# Patient Record
Sex: Male | Born: 1950 | Race: White | Hispanic: No | Marital: Married | Smoking: Never smoker
Health system: Southern US, Community
[De-identification: ages and names within clinical notes are randomized; demographics above are authoritative.]

## PROBLEM LIST (undated history)

## (undated) DIAGNOSIS — C801 Malignant (primary) neoplasm, unspecified: Secondary | ICD-10-CM

## (undated) DIAGNOSIS — L570 Actinic keratosis: Secondary | ICD-10-CM

## (undated) DIAGNOSIS — I10 Essential (primary) hypertension: Secondary | ICD-10-CM

## (undated) DIAGNOSIS — E785 Hyperlipidemia, unspecified: Secondary | ICD-10-CM

## (undated) HISTORY — PX: COLONOSCOPY: SHX174

## (undated) HISTORY — DX: Actinic keratosis: L57.0

## (undated) HISTORY — PX: COLON SURGERY: SHX602

---

## 2008-02-19 ENCOUNTER — Inpatient Hospital Stay: Payer: Self-pay | Admitting: General Surgery

## 2008-02-19 ENCOUNTER — Other Ambulatory Visit: Payer: Self-pay

## 2008-03-18 ENCOUNTER — Ambulatory Visit: Payer: Self-pay | Admitting: General Surgery

## 2008-06-24 ENCOUNTER — Ambulatory Visit: Payer: Self-pay | Admitting: General Surgery

## 2008-11-08 ENCOUNTER — Ambulatory Visit: Payer: Self-pay | Admitting: Unknown Physician Specialty

## 2009-10-31 IMAGING — US ABDOMEN ULTRASOUND
1 series · 17 of 25 positions shown · non-contrast
Comparison: none

REASON FOR EXAM: Hypodense renal and liver lesions identified on CT,
assess for cystic disease
COMMENTS:

[Series 1: abdomen ultrasound · 17 of 103 slices shown]
[im 1/103]
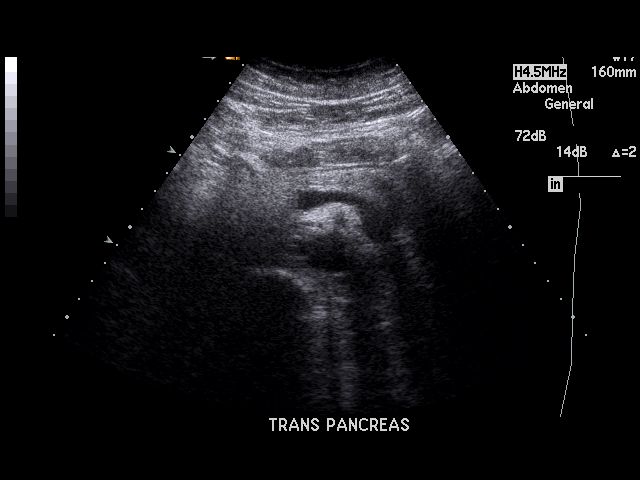
[im 9/103]
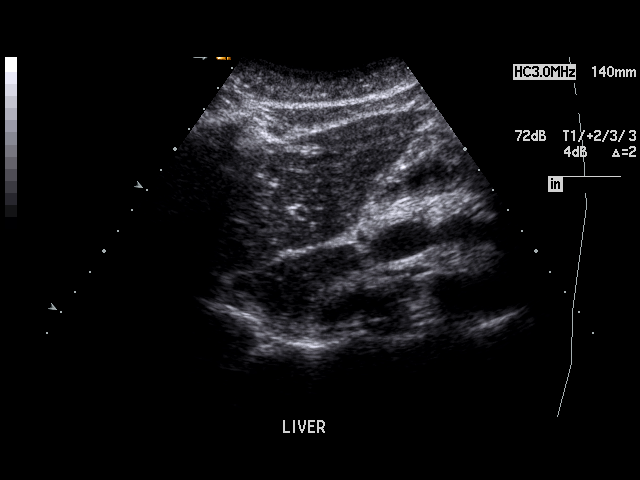
[im 13/103]
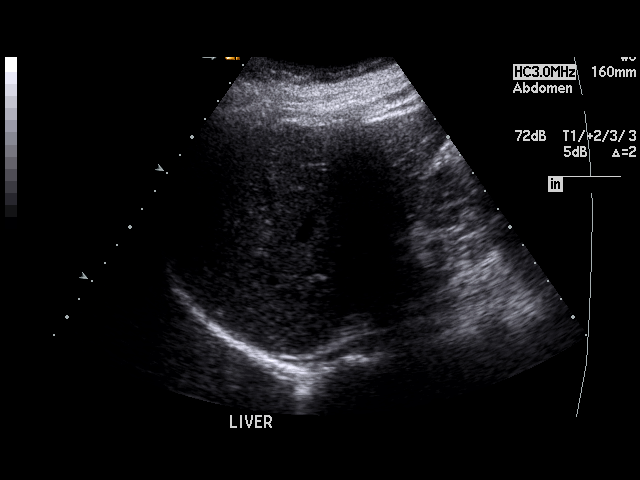
[im 22/103]
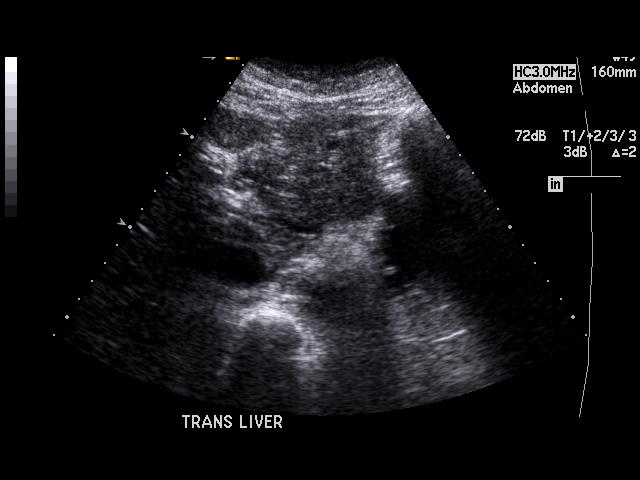
[im 26/103]
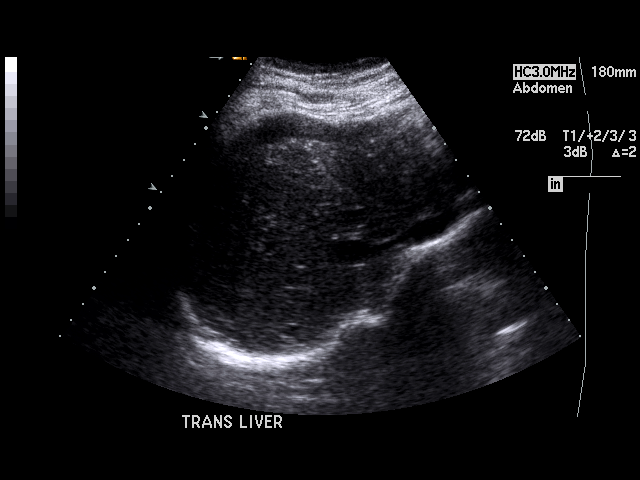
[im 35/103]
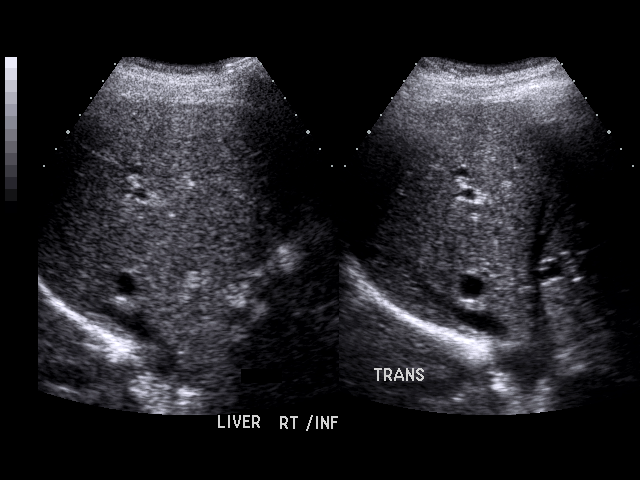
[im 39/103]
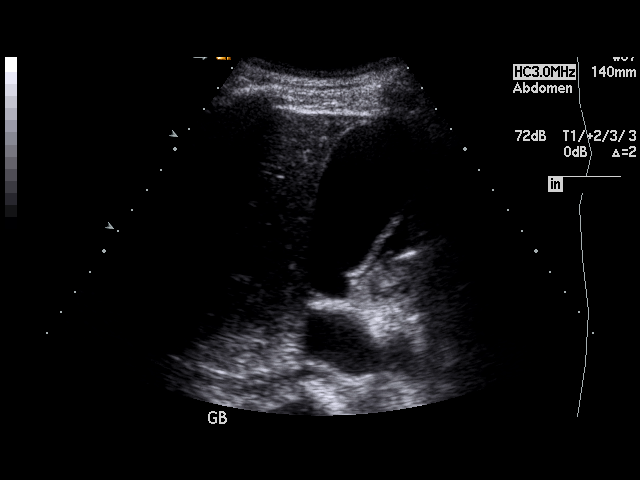
[im 47/103]
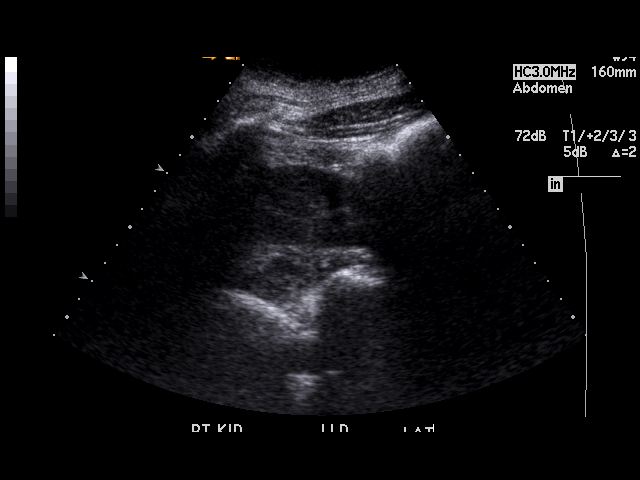
[im 52/103]
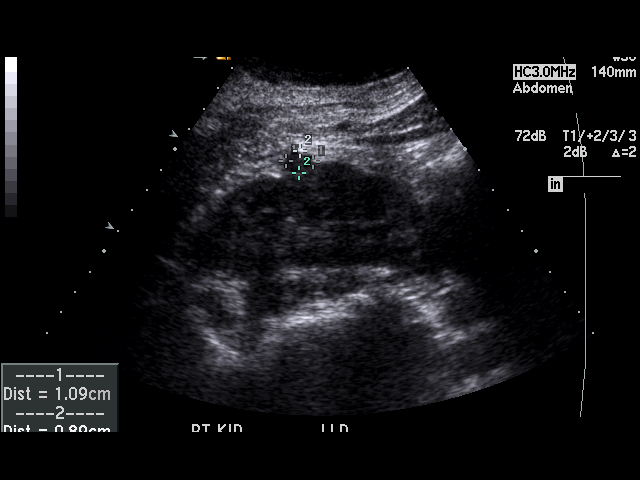
[im 56/103]
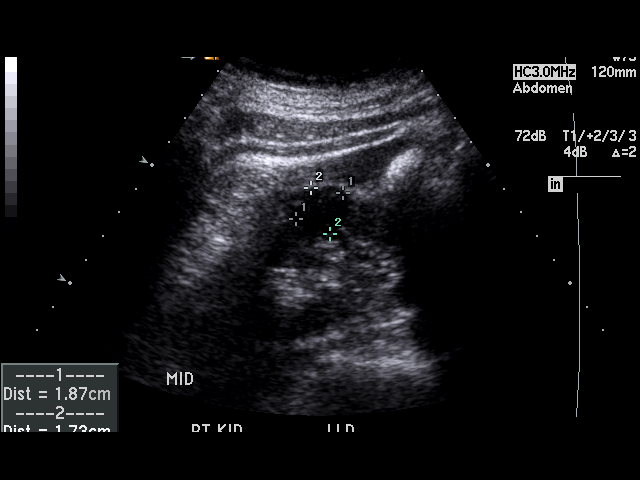
[im 64/103]
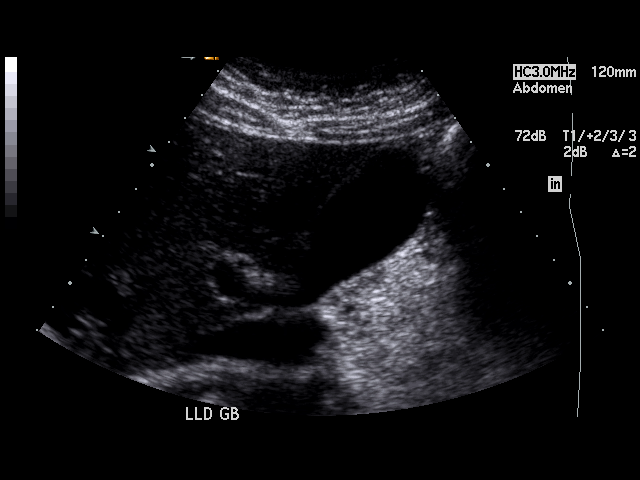
[im 69/103]
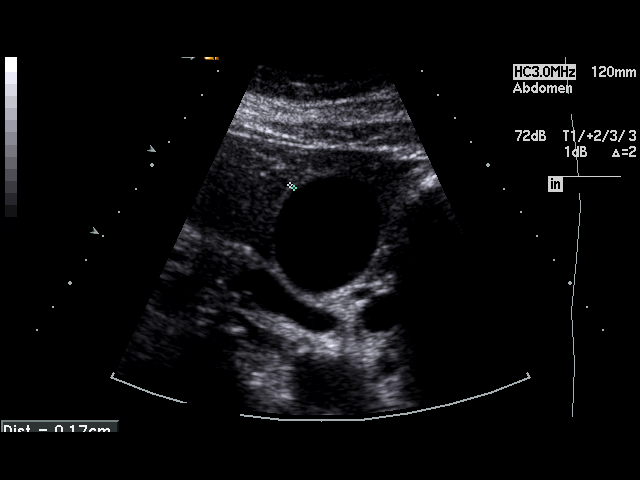
[im 77/103]
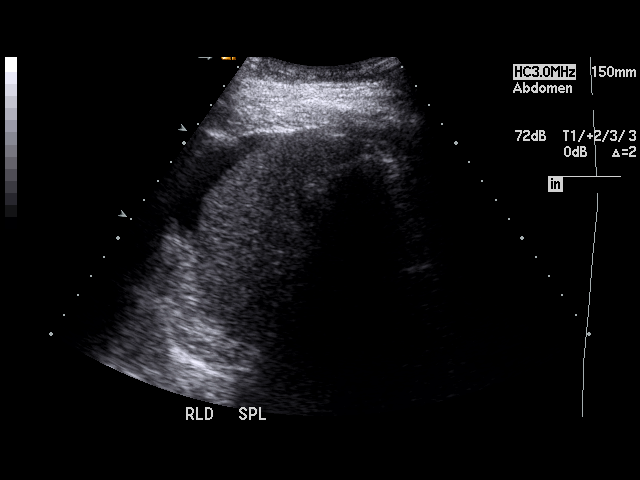
[im 81/103]
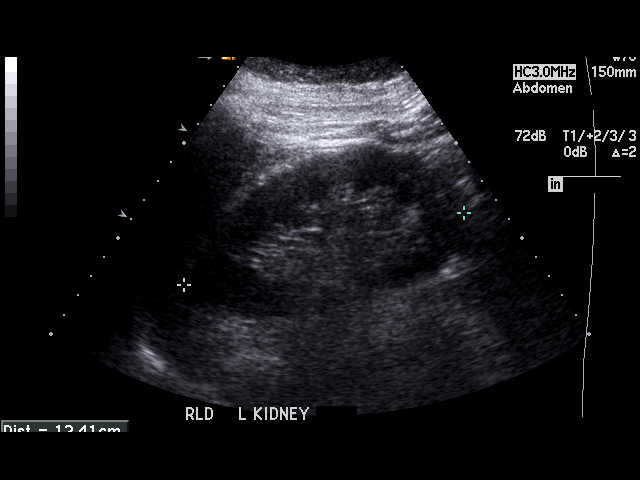
[im 90/103]
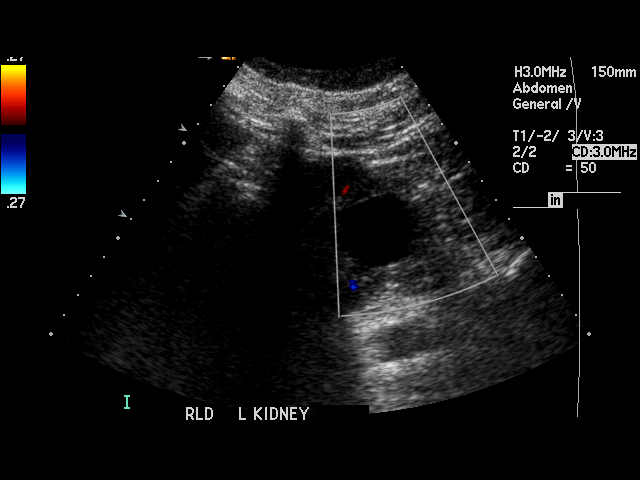
[im 94/103]
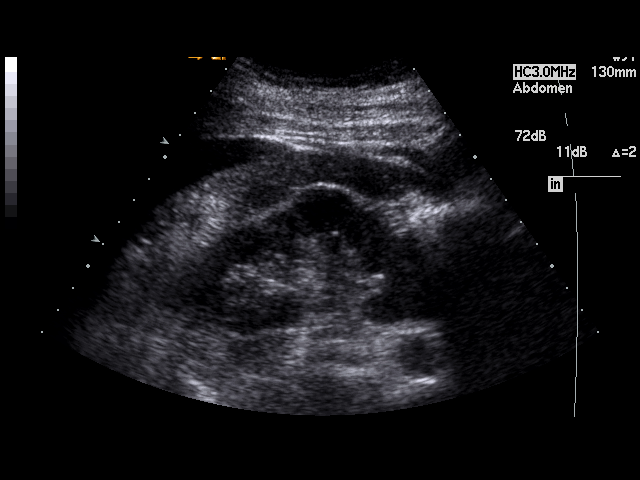
[im 103/103]
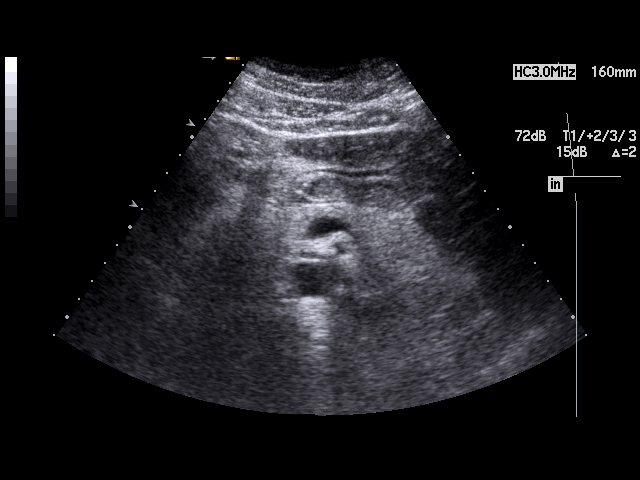

[17 of 25 positions shown; findings below may reference images not displayed]

PROCEDURE:     US  - US ABDOMEN GENERAL SURVEY  - February 20, 2008  [DATE]

RESULT:     The hepatic echo pattern is homogeneous except for multiple,
small hypoechoic areas compatible with small hepatic cysts. The spleen size
is normal. The pancreas is in great part obscured by bowel gas. The inferior
vena cava and abdominal aorta show no significant abnormalities. No
gallstones are seen. There is no thickening of the gallbladder wall. The
common bile duct measures 3.2 mm in diameter which is within normal limits.
The kidneys show no hydronephrosis. There is a complex, 2.25 cm mass of the
RIGHT kidney which has been previously noted at CT and which could be
further evaluated with dedicated renal CT, if clinically indicated.
Additionally, there is noted a 3.26 cm, simple cyst of the LEFT kidney. No
hydronephrosis is seen. Trace ascites is observed.
IMPRESSION: 1.  There is a nonspecific, complex mass of the RIGHT kidney, previously
noted at CT and which could be further evaluated by dedicated renal CT, if
clinically indicated.
2.  Multiple hepatic cysts are also observed.
3.  No gallstones are noted.
4.  Trace ascites.

## 2012-03-03 ENCOUNTER — Ambulatory Visit: Payer: Self-pay | Admitting: Unknown Physician Specialty

## 2012-03-06 LAB — PATHOLOGY REPORT

## 2016-03-30 DIAGNOSIS — D519 Vitamin B12 deficiency anemia, unspecified: Secondary | ICD-10-CM | POA: Insufficient documentation

## 2017-07-19 DIAGNOSIS — S92902A Unspecified fracture of left foot, initial encounter for closed fracture: Secondary | ICD-10-CM

## 2017-07-19 HISTORY — DX: Unspecified fracture of left foot, initial encounter for closed fracture: S92.902A

## 2019-08-07 ENCOUNTER — Ambulatory Visit: Payer: Medicare Other | Attending: Internal Medicine

## 2019-08-07 DIAGNOSIS — Z23 Encounter for immunization: Secondary | ICD-10-CM

## 2019-08-07 NOTE — Progress Notes (Signed)
   Covid-19 Vaccination Clinic  Name:  Billy Wells    MRN: [id number] DOB: [birth year]  08/07/2019  Billy Wells was observed post Covid-19 immunization for 15 minutes without incidence. He was provided with Vaccine Information Sheet and instruction to access the V-Safe system.   Billy Wells was instructed to call 911 with any severe reactions post vaccine: Marland Kitchen Difficulty breathing  . Swelling of your face and throat  . A fast heartbeat  . A bad rash all over your body  . Dizziness and weakness    Immunizations Administered    Name Date Dose VIS Date Route   Pfizer COVID-19 Vaccine 08/07/2019  6:09 PM 0.3 mL 06/29/2019 Intramuscular   Manufacturer: [place]   Lot: [id number]   [place]: [id number]

## 2019-08-25 ENCOUNTER — Ambulatory Visit: Payer: Medicare Other | Attending: Internal Medicine

## 2019-08-25 DIAGNOSIS — Z23 Encounter for immunization: Secondary | ICD-10-CM | POA: Insufficient documentation

## 2019-08-25 NOTE — Progress Notes (Signed)
   Covid-19 Vaccination Clinic  Name:  Billy Wells    MRN: [id number] DOB: 24-Sep-1950  08/25/2019  Mr. Wells was observed post Covid-19 immunization for 15 minutes without incidence. He was provided with Vaccine Information Sheet and instruction to access the V-Safe system.   Mr. Wells was instructed to call 911 with any severe reactions post vaccine: Marland Kitchen Difficulty breathing  . Swelling of your face and throat  . A fast heartbeat  . A bad rash all over your body  . Dizziness and weakness    Immunizations Administered    Name Date Dose VIS Date Route   Pfizer COVID-19 Vaccine 08/25/2019  1:25 PM 0.3 mL 06/29/2019 Intramuscular   Manufacturer: [place]   Lot: [id number]   [place]: [id number]

## 2020-05-26 ENCOUNTER — Ambulatory Visit: Payer: Self-pay | Admitting: Urology

## 2020-05-29 ENCOUNTER — Other Ambulatory Visit: Payer: Self-pay

## 2020-05-29 ENCOUNTER — Encounter: Payer: Self-pay | Admitting: Urology

## 2020-05-29 ENCOUNTER — Ambulatory Visit (INDEPENDENT_AMBULATORY_CARE_PROVIDER_SITE_OTHER): Payer: Medicare Other | Admitting: Urology

## 2020-05-29 VITALS — BP 153/90 | HR 69 | Ht 73.0 in | Wt 175.0 lb

## 2020-05-29 DIAGNOSIS — R972 Elevated prostate specific antigen [PSA]: Secondary | ICD-10-CM | POA: Diagnosis not present

## 2020-05-29 DIAGNOSIS — N400 Enlarged prostate without lower urinary tract symptoms: Secondary | ICD-10-CM | POA: Diagnosis not present

## 2020-05-29 NOTE — Progress Notes (Signed)
   05/29/2020 10:31 AM   Billy Wells [birth year] [id number]  Referring provider: [name], MD [place],  [place] [address]  Chief Complaint  Patient presents with  . Elevated PSA    HPI: Billy Wells is a 69 y.o. male seen at the request of Dr. Ola Spurr for evaluation of an elevated PSA.   PSA 05/09/2020 10.9  Slowly rising PSA since 2019 at 4.13-10.9  Denies family history prostate cancer  No bothersome LUTS  Denies dysuria, gross hematuria, recurrent UTI  No flank, abdominal or pelvic pain  Denies prior urologic evaluation       PMH: . Hyperlipidemia  . Hypertension   Surgical History:  None  Home Medications:  Allergies as of 05/29/2020   No Known Allergies     Medication List       Accurate as of May 29, 2020 10:31 AM. If you have any questions, ask your nurse or doctor.        amLODipine 10 MG tablet Commonly known as: NORVASC Take 1 tablet by mouth daily.   Saw Palmetto 450 MG Caps Take by mouth.   tamsulosin 0.4 MG Caps capsule Commonly known as: FLOMAX Take by mouth.       Allergies: No Known Allergies  Family History: History reviewed. No pertinent family history.  Social History:  reports that he has never smoked. He has never used smokeless tobacco. He reports current alcohol use. No history on file for drug use.   Physical Exam: BP (!) 153/90   Pulse 69   Ht 6\' 1"  (1.854 m)   Wt 175 lb (79.4 kg)   BMI 23.09 kg/m   Constitutional:  Alert and oriented, No acute distress. HEENT: [place] AT, moist mucus membranes.  Trachea midline, no masses. Cardiovascular: No clubbing, cyanosis, or edema. Respiratory: Normal respiratory effort, no increased work of breathing. GU: Prostate 60+ cc, L >R without induration or nodularity Lymph: No cervical or inguinal lymphadenopathy. Skin: No rashes, bruises or suspicious lesions. Neurologic: Grossly intact, no focal deficits, moving all 4  extremities. Psychiatric: Normal mood and affect.   Assessment & Plan:    1.  Elevated PSA  Although PSA is a prostate cancer screening test he was informed that cancer is not the most common cause of an elevated PSA. Other potential causes including BPH and inflammation were discussed. He was informed that the only way to adequately diagnose prostate cancer would be a transrectal ultrasound and biopsy of the prostate. The procedure was discussed including potential risks of bleeding and infection/sepsis. He was also informed that a negative biopsy does not conclusively rule out the possibility that prostate cancer may be present and that continued monitoring is required. The use of newer adjunctive blood tests including PHI and 4kScore were discussed. The use of multiparametric prostate MRI was also discussed however is not typically used for initial evaluation of an elevated PSA. Continued periodic surveillance was also discussed.  Have initially recommended a prostate MRI for further evaluation would not recommend continued surveillance.  He was in agreement with this plan    Abbie Sons, MD  [facility] [address], [place], [place] [address] [phone]

## 2020-06-01 ENCOUNTER — Encounter: Payer: Self-pay | Admitting: Urology

## 2020-06-19 ENCOUNTER — Other Ambulatory Visit: Payer: Self-pay

## 2020-06-19 ENCOUNTER — Ambulatory Visit
Admission: RE | Admit: 2020-06-19 | Discharge: 2020-06-19 | Disposition: A | Discharge status: Home / Self Care | Payer: Medicare Other | Source: Ambulatory Visit | Attending: Urology | Admitting: Urology

## 2020-06-19 DIAGNOSIS — R972 Elevated prostate specific antigen [PSA]: Secondary | ICD-10-CM | POA: Diagnosis present

## 2020-06-19 MED ORDER — GADOBUTROL 1 MMOL/ML IV SOLN
8.0000 mL | Freq: Once | INTRAVENOUS | Status: AC | PRN
  Administered 2020-06-19: 8 mL via INTRAVENOUS

## 2020-06-20 ENCOUNTER — Encounter: Payer: Self-pay | Admitting: Urology

## 2020-06-22 ENCOUNTER — Other Ambulatory Visit: Payer: Self-pay | Admitting: Urology

## 2020-06-22 DIAGNOSIS — R972 Elevated prostate specific antigen [PSA]: Secondary | ICD-10-CM

## 2020-07-31 ENCOUNTER — Other Ambulatory Visit: Payer: Self-pay | Admitting: Urology

## 2020-08-06 ENCOUNTER — Encounter: Payer: Self-pay | Admitting: Urology

## 2020-08-06 ENCOUNTER — Ambulatory Visit (INDEPENDENT_AMBULATORY_CARE_PROVIDER_SITE_OTHER): Payer: Medicare Other | Admitting: Urology

## 2020-08-06 ENCOUNTER — Other Ambulatory Visit: Payer: Self-pay

## 2020-08-06 VITALS — BP 155/85 | HR 71 | Ht 73.0 in | Wt 178.0 lb

## 2020-08-06 DIAGNOSIS — C61 Malignant neoplasm of prostate: Secondary | ICD-10-CM | POA: Diagnosis not present

## 2020-08-06 NOTE — Progress Notes (Signed)
   08/06/2020 10:30 AM   Billy Wells 05-Jul-1951 [id number]  Referring provider: [name], MD [place],  [place] [address]  Chief Complaint  Patient presents with  . Elevated PSA    HPI: 70 y.o. male presents for prostate biopsy results.   MR fusion biopsy performed in [facility] 07/28/2020 for PSA of 10.1 and PI-RADS 4/3 lesions on MRI  No postbiopsy complaints; prostate volume 45.1 g  Standard 12 core biopsy performed in addition to 3 ROI cores PI-RADS 4 lesion right and 3 ROI cores PI-RADS 3 lesion left  Pathology: The PI-RADS 4 lesion with 2/3 cores positive Gleason 4+3 adenocarcinoma involving 70% of each core   Right mid template core with Gleason 3+3 adenocarcinoma 5%   PMH: No past medical history on file.  Surgical History: No past surgical history on file.  Home Medications:  Allergies as of 08/06/2020   No Known Allergies     Medication List       Accurate as of August 06, 2020 10:30 AM. If you have any questions, ask your nurse or doctor.        amLODipine 10 MG tablet Commonly known as: NORVASC Take 1 tablet by mouth daily.   levofloxacin 750 MG tablet Commonly known as: LEVAQUIN Take 750 mg by mouth daily.   Saw Palmetto 450 MG Caps Take by mouth.   tamsulosin 0.4 MG Caps capsule Commonly known as: FLOMAX Take by mouth.       Allergies: No Known Allergies  Family History: No family history on file.  Social History:  reports that he has never smoked. He has never used smokeless tobacco. He reports current alcohol use. No history on file for drug use.   Physical Exam: BP (!) 155/85   Pulse 71   Ht 6\' 1"  (1.854 m)   Wt 178 lb (80.7 kg)   BMI 23.48 kg/m   Constitutional:  Alert, No acute distress.   Assessment & Plan:    1.  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  Based on age and pathology surveillance not  recommended  Curative treatment options were discussed including radical prostatectomy and radiation modalities  Potential side effects radical prostatectomy discussed including erectile dysfunction and urinary incontinence  Side effects of radiation were reviewed including GI and bladder irritation/bleeding  It was discussed that there is not considered a "best" treatment for prostate cancer and that radiation and surgery are generally considered equivalent  He was provided literature on prostate cancer treatment  I did recommend a radiation oncology appointment to discuss radiation further and offered him appointment with 1 of my partners if he desires more information on  I spent 30 total minutes on the day of the encounter including pre-visit review of the medical record, face-to-face time with the patient, and post visit ordering of labs/imaging/tests.   Abbie Sons, [facility] [address] [place], [place] [address] [phone]

## 2020-08-11 ENCOUNTER — Ambulatory Visit (INDEPENDENT_AMBULATORY_CARE_PROVIDER_SITE_OTHER): Payer: Medicare Other | Admitting: Dermatology

## 2020-08-11 ENCOUNTER — Other Ambulatory Visit: Payer: Self-pay

## 2020-08-11 DIAGNOSIS — D1800 Hemangioma unspecified site: Secondary | ICD-10-CM

## 2020-08-11 DIAGNOSIS — L814 Other melanin hyperpigmentation: Secondary | ICD-10-CM

## 2020-08-11 DIAGNOSIS — L821 Other seborrheic keratosis: Secondary | ICD-10-CM

## 2020-08-11 DIAGNOSIS — L578 Other skin changes due to chronic exposure to nonionizing radiation: Secondary | ICD-10-CM | POA: Diagnosis not present

## 2020-08-11 DIAGNOSIS — D692 Other nonthrombocytopenic purpura: Secondary | ICD-10-CM

## 2020-08-11 DIAGNOSIS — Z1283 Encounter for screening for malignant neoplasm of skin: Secondary | ICD-10-CM | POA: Diagnosis not present

## 2020-08-11 DIAGNOSIS — L720 Epidermal cyst: Secondary | ICD-10-CM

## 2020-08-11 DIAGNOSIS — D229 Melanocytic nevi, unspecified: Secondary | ICD-10-CM

## 2020-08-11 NOTE — Progress Notes (Signed)
   Follow-Up Visit   Subjective  Billy Wells is a 70 y.o. male who presents for the following: Annual Exam (UBSE. Hx Aks. No hx of skin cancer. No areas of concern today.).   The following portions of the chart were reviewed this encounter and updated as appropriate:       Review of Systems:  No other skin or systemic complaints except as noted in HPI or Assessment and Plan.  Objective  Well appearing patient in no apparent distress; mood and affect are within normal limits.  All skin waist up examined.  Objective  Spinal Mid Back: Large dilated pore c/w punctum   Assessment & Plan   Skin cancer screening performed today.  Actinic Damage - chronic, secondary to cumulative UV radiation exposure/sun exposure over time - diffuse scaly erythematous macules with underlying dyspigmentation - Recommend daily broad spectrum sunscreen SPF 30+ to sun-exposed areas, reapply every 2 hours as needed.  - Call for new or changing lesions.  Seborrheic Keratoses - Stuck-on, waxy, tan-brown macules/papules - Discussed benign etiology and prognosis. - Observe - Call for any changes  Lentigines - Scattered tan macules - Discussed due to sun exposure - Benign, observe - Recommend daily broad spectrum sunscreen SPF 30+ to sun-exposed areas, reapply every 2 hours as needed. - Call for any changes  Purpura - Chronic; persistent and recurrent.  Treatable, but not curable. - Violaceous macules and patches - Benign - Related to trauma, age, sun damage and/or use of blood thinners, chronic use of topical and/or oral steroids - Observe - Can use OTC arnica containing moisturizer such as Dermend Bruise Formula if desired - Call for worsening or other concerns  Hemangiomas - Red papules - Discussed benign nature - Observe - Call for any changes  Melanocytic Nevi - Tan-brown and/or pink-flesh-colored symmetric macules and papules - Benign appearing on exam today - Observation -  Call clinic for new or changing moles - Recommend daily use of broad spectrum spf 30+ sunscreen to sun-exposed areas.   Milia - tiny firm white papules of the right medial canthus - type of cyst - benign - may be extracted if symptomatic - observe   Epidermal inclusion cyst Spinal Mid Back  Benign-appearing. Exam most consistent with an epidermal inclusion cyst. Discussed that a cyst is a benign growth that can grow over time and sometimes get irritated or inflamed. Recommend observation if it is not bothersome. Discussed option of surgical excision to remove it if it is growing, symptomatic, or other changes noted. Please call for new or changing lesions so they can be evaluated.    Return in about 1 year (around 08/11/2021) for UBSE.   IJamesetta Orleans, CMA, am acting as scribe for Brendolyn Patty, MD .  Documentation: I have reviewed the above documentation for accuracy and completeness, and I agree with the above.  Brendolyn Patty MD

## 2020-08-11 NOTE — Patient Instructions (Signed)
Recommend daily broad spectrum sunscreen SPF 30+ to sun-exposed areas, reapply every 2 hours as needed. Call for new or changing lesions.  

## 2020-08-13 ENCOUNTER — Ambulatory Visit
Admission: RE | Admit: 2020-08-13 | Discharge: 2020-08-13 | Disposition: A | Discharge status: Home / Self Care | Payer: Medicare Other | Source: Ambulatory Visit | Attending: Radiation Oncology | Admitting: Radiation Oncology

## 2020-08-13 ENCOUNTER — Encounter: Payer: Self-pay | Admitting: Radiation Oncology

## 2020-08-13 ENCOUNTER — Encounter: Payer: Self-pay | Admitting: Urology

## 2020-08-13 VITALS — BP 145/88 | HR 70 | Temp 97.3°F | Wt 182.5 lb

## 2020-08-13 DIAGNOSIS — Z923 Personal history of irradiation: Secondary | ICD-10-CM | POA: Diagnosis not present

## 2020-08-13 DIAGNOSIS — Z79899 Other long term (current) drug therapy: Secondary | ICD-10-CM | POA: Insufficient documentation

## 2020-08-13 DIAGNOSIS — C61 Malignant neoplasm of prostate: Secondary | ICD-10-CM | POA: Diagnosis not present

## 2020-08-13 NOTE — Consult Note (Signed)
NEW PATIENT EVALUATION  Name: Billy Wells  MRN: [id number]  Date:   08/13/2020     DOB: [birth year]   This 70 y.o. 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  REFERRING PHYSICIAN: [name], MD  CHIEF COMPLAINT:  Chief Complaint  Patient presents with  . Prostate Cancer    DIAGNOSIS: The encounter diagnosis was Prostate cancer ([place]).   PREVIOUS INVESTIGATIONS:  Clinical notes reviewed Prostate MRI reviewed Pathology report reviewed   HPI: Patient is a 70 year old male presented with a rising PSA back over 4 in 2019 most recently was 10.9.  Patient underwent an MRI study showing a PI-RADS category 4 lesion of the right anterior posterior transitional zone and possible right anterior fibromuscular stroma in the mid gland.  Also had a PI-RADS category 3 lesion of the left anterior and posterior transition zone.  He underwent MRI fusion biopsy showing 2 of 3 cores and areas of concern positive for Gleason 7 (4+3).  One of the core was positive for Gleason 6 (3+3).  Patient has very little symptoms.  Specifically denies urgency frequency nocturia.  He has been seen by Dr. Bernardo Heater and discussion regarding robotic prostatectomy was made.  He is now referred to radiation oncology for opinion.  He is having no bone pain.  Patient's prostate volume is closed close to 60 cc making I-125 interstitial implant not recommended again is fairly asymptomatic.  PLANNED TREATMENT REGIMEN: Robotic prostatectomy versus image guided IMRT radiation  PAST MEDICAL HISTORY:  has no past medical history on file.    PAST SURGICAL HISTORY: No past surgical history on file.  FAMILY HISTORY: family history is not on file.  SOCIAL HISTORY:  reports that he has never smoked. He has never used smokeless tobacco. He reports current alcohol use.  ALLERGIES: Patient has no known  allergies.  MEDICATIONS:  Current Outpatient Medications  Medication Sig Dispense Refill  . amLODipine (NORVASC) 10 MG tablet Take 1 tablet by mouth daily.    . Saw Palmetto 450 MG CAPS Take by mouth.    . tamsulosin (FLOMAX) 0.4 MG CAPS capsule Take by mouth.    . levofloxacin (LEVAQUIN) 750 MG tablet Take 750 mg by mouth daily. (Patient not taking: No sig reported)     No current facility-administered medications for this encounter.    ECOG PERFORMANCE STATUS:  0 - Asymptomatic  REVIEW OF SYSTEMS: Patient denies any weight loss, fatigue, weakness, fever, chills or night sweats. Patient denies any loss of vision, blurred vision. Patient denies any ringing  of the ears or hearing loss. No irregular heartbeat. Patient denies heart murmur or history of fainting. Patient denies any chest pain or pain radiating to her upper extremities. Patient denies any shortness of breath, difficulty breathing at night, cough or hemoptysis. Patient denies any swelling in the lower legs. Patient denies any nausea vomiting, vomiting of blood, or coffee ground material in the vomitus. Patient denies any stomach pain. Patient states has had normal bowel movements no significant constipation or diarrhea. Patient denies any dysuria, hematuria or significant nocturia. Patient denies any problems walking, swelling in the joints or loss of balance. Patient denies any skin changes, loss of hair or loss of weight. Patient denies any excessive worrying or anxiety or significant depression. Patient denies any problems with insomnia. Patient denies excessive thirst, polyuria, polydipsia. Patient denies any swollen glands, patient denies easy  bruising or easy bleeding. Patient denies any recent infections, allergies or URI. Patient "s visual fields have not changed significantly in recent time.   PHYSICAL EXAM: BP (!) 145/88   Pulse 70   Temp (!) 97.3 F (36.3 C) (Tympanic)   Wt 182 lb 8 oz (82.8 kg)   BMI 24.08 kg/m   Well-developed well-nourished patient in NAD. HEENT reveals PERLA, EOMI, discs not visualized.  Oral cavity is clear. No oral mucosal lesions are identified. Neck is clear without evidence of cervical or supraclavicular adenopathy. Lungs are clear to A&P. Cardiac examination is essentially unremarkable with regular rate and rhythm without murmur rub or thrill. Abdomen is benign with no organomegaly or masses noted. Motor sensory and DTR levels are equal and symmetric in the upper and lower extremities. Cranial nerves II through XII are grossly intact. Proprioception is intact. No peripheral adenopathy or edema is identified. No motor or sensory levels are noted. Crude visual fields are within normal range.  LABORATORY DATA: Pathology report reviewed    RADIOLOGY RESULTS: MRI scan reviewed compatible with above-stated findings   IMPRESSION: Intermediate stage prostate cancer Gleason 7 (4+3) in patient with PSA of 73 in 70 year old male   PLAN: At this time patient would not be a candidate based on his prostate volume for I-125 interstitial implant I discussed the risk and benefits of image guided IMRT radiation therapy.  Risks include increased lower urinary tract symptoms such as urgency frequency nocturia during treatment possible diarrhea possible fatigue alteration of blood counts.  If he decides on image guided treatment I would asked Dr. Bernardo Heater to place fiducial markers in his prostate.  Also would asked to start 47-month Eligard since studies now show significant improvement in local regional control adding ADT therapy with radiation.  Patient will be deciding between robotic prostatectomy and external beam treatment over the next several next several weeks and will get back to [place] I have definitely discussed continuing observation is not an option.  Patient comprehends my recommendations well.  I would like to take this opportunity to thank you for allowing me to participate in the care of your  patient.Noreene Filbert, MD

## 2020-09-02 ENCOUNTER — Other Ambulatory Visit: Payer: Self-pay | Admitting: Urology

## 2021-03-26 ENCOUNTER — Other Ambulatory Visit: Payer: Self-pay

## 2021-03-26 ENCOUNTER — Ambulatory Visit (INDEPENDENT_AMBULATORY_CARE_PROVIDER_SITE_OTHER): Payer: Medicare Other | Admitting: Dermatology

## 2021-03-26 DIAGNOSIS — L578 Other skin changes due to chronic exposure to nonionizing radiation: Secondary | ICD-10-CM

## 2021-03-26 DIAGNOSIS — L820 Inflamed seborrheic keratosis: Secondary | ICD-10-CM | POA: Diagnosis not present

## 2021-03-26 DIAGNOSIS — L570 Actinic keratosis: Secondary | ICD-10-CM | POA: Diagnosis not present

## 2021-03-26 NOTE — Patient Instructions (Signed)
Actinic keratoses are precancerous spots that appear secondary to cumulative UV radiation exposure/sun exposure over time. They are chronic with expected duration over 1 year. A portion of actinic keratoses will progress to squamous cell carcinoma of the skin. It is not possible to reliably predict which spots will progress to skin cancer and so treatment is recommended to prevent development of skin cancer.  Recommend daily broad spectrum sunscreen SPF 30+ to sun-exposed areas, reapply every 2 hours as needed.  Recommend staying in the shade or wearing long sleeves, sun glasses (UVA+UVB protection) and wide brim hats (4-inch brim around the entire circumference of the hat). Call for new or changing lesions.   Cryotherapy Aftercare  Wash gently with soap and water everyday.   Apply Vaseline and Band-Aid daily until healed.   Seborrheic Keratosis  What causes seborrheic keratoses? Seborrheic keratoses are harmless, common skin growths that first appear during adult life.  As time goes by, more growths appear.  Some people may develop a large number of them.  Seborrheic keratoses appear on both covered and uncovered body parts.  They are not caused by sunlight.  The tendency to develop seborrheic keratoses can be inherited.  They vary in color from skin-colored to gray, brown, or even black.  They can be either smooth or have a rough, warty surface.   Seborrheic keratoses are superficial and look as if they were stuck on the skin.  Under the microscope this type of keratosis looks like layers upon layers of skin.  That is why at times the top layer may seem to fall off, but the rest of the growth remains and re-grows.    Treatment Seborrheic keratoses do not need to be treated, but can easily be removed in the office.  Seborrheic keratoses often cause symptoms when they rub on clothing or jewelry.  Lesions can be in the way of shaving.  If they become inflamed, they can cause itching, soreness, or  burning.  Removal of a seborrheic keratosis can be accomplished by freezing, burning, or surgery. If any spot bleeds, scabs, or grows rapidly, please return to have it checked, as these can be an indication of a skin cancer.   If you have any questions or concerns for your doctor, please call our main line at [phone] and press option 4 to reach your doctor's medical assistant. If no one answers, please leave a voicemail as directed and we will return your call as soon as possible. Messages left after 4 pm will be answered the following business day.   You may also send us a message via MyChart. We typically respond to MyChart messages within 1-2 business days.  For prescription refills, please ask your pharmacy to contact our office. Our fax number is [phone].  If you have an urgent issue when the clinic is closed that cannot wait until the next business day, you can page your doctor at the number below.    Please note that while we do our best to be available for urgent issues outside of office hours, we are not available 24/7.   If you have an urgent issue and are unable to reach us, you may choose to seek medical care at your doctor's office, retail clinic, urgent care center, or emergency room.  If you have a medical emergency, please immediately call 911 or go to the emergency department.  Pager Numbers  - Dr. Kowalski: [phone]  - Dr. Moye: [phone]  - Dr. Stewart: [phone]  In the   event of inclement weather, please call our main line at [phone] for an update on the status of any delays or closures.  Dermatology Medication Tips: Please keep the boxes that topical medications come in in order to help keep track of the instructions about where and how to use these. Pharmacies typically print the medication instructions only on the boxes and not directly on the medication tubes.   If your medication is too expensive, please contact our office at  [phone] option 4 or send us a message through MyChart.   We are unable to tell what your co-pay for medications will be in advance as this is different depending on your insurance coverage. However, we may be able to find a substitute medication at lower cost or fill out paperwork to get insurance to cover a needed medication.   If a prior authorization is required to get your medication covered by your insurance company, please allow us 1-2 business days to complete this process.  Drug prices often vary depending on where the prescription is filled and some pharmacies may offer cheaper prices.  The website www.goodrx.com contains coupons for medications through different pharmacies. The prices here do not account for what the cost may be with help from insurance (it may be cheaper with your insurance), but the website can give you the price if you did not use any insurance.  - You can print the associated coupon and take it with your prescription to the pharmacy.  - You may also stop by our office during regular business hours and pick up a GoodRx coupon card.  - If you need your prescription sent electronically to a different pharmacy, notify our office through [facility] MyChart or by phone at [phone] option 4.  

## 2021-03-26 NOTE — Progress Notes (Signed)
Follow-Up Visit   Subjective  Billy Wells is a 70 y.o. male who presents for the following: Follow-up (Patient here today concerning a spot on his left lower leg. He reports it has been there for a year or more but it will not go away. Patient has a bump at corner of left eye he noticed 3 months ago and is slowly growing. Patient reports that he been undergoing radiation treatment for prostate cancer. ).  The following portions of the chart were reviewed this encounter and updated as appropriate:  Tobacco  Allergies  Meds  Problems  Med Hx  Surg Hx  Fam Hx     Objective  Well appearing patient in no apparent distress; mood and affect are within normal limits.  A focused examination was performed including left distal pretibial and right medial lower eyelid. Relevant physical exam findings are noted in the Assessment and Plan.  right medial lower eyelid x 1 Cutaneous horn   left distal pretibial x 1 Erythematous keratotic or waxy stuck-on papule or plaque.   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 They are chronic with expected duration over 1 year. A portion of actinic keratoses will progress to squamous cell carcinoma of the skin. It is not possible to reliably predict which spots will progress to skin cancer and so treatment is recommended to prevent development of skin cancer.  Recommend daily broad spectrum sunscreen SPF 30+ to sun-exposed areas, reapply every 2 hours as needed.  Recommend staying in the shade or wearing long sleeves, sun glasses (UVA+UVB protection) and wide brim hats (4-inch brim around the entire circumference of the hat). Call for new or changing lesions.  Destruction of lesion - right medial lower eyelid x 1 Complexity: simple   Destruction method:  cryotherapy   Informed consent: discussed and consent obtained   Timeout:  patient name, date of birth, surgical site, and procedure verified Lesion destroyed using liquid nitrogen: Yes   Region frozen until ice ball extended beyond lesion: Yes   Outcome: patient tolerated procedure well with no complications   Post-procedure details: wound care instructions given    Inflamed seborrheic keratosis left distal pretibial x 1  Destruction of lesion - left distal pretibial x 1 Complexity: simple   Destruction method: cryotherapy   Informed consent: discussed and consent obtained   Timeout:  patient name, date of birth, surgical site, and procedure verified Lesion destroyed using liquid nitrogen: Yes   Region frozen until ice ball extended beyond lesion: Yes   Outcome: patient tolerated procedure well with no complications   Post-procedure details: wound care instructions given    Actinic Damage - chronic, secondary to cumulative UV radiation exposure/sun exposure over time - diffuse scaly erythematous macules with underlying dyspigmentation - Recommend daily broad spectrum sunscreen SPF 30+ to sun-exposed areas, reapply every 2 hours as needed.  - Recommend staying in the shade or wearing long sleeves, sun glasses (UVA+UVB protection) and wide brim hats (4-inch brim around the entire circumference of the hat). - Call for new or changing lesions.  Return in about 3 months (around 06/25/2021) for ak followup and isk follow up. IRuthell Rummage, CMA, am acting as scribe for Sarina Ser, MD. Documentation: I have reviewed the above documentation for accuracy and completeness, and I agree with the above.  Sarina Ser, MD

## 2021-03-31 ENCOUNTER — Encounter: Payer: Self-pay | Admitting: Dermatology

## 2021-07-02 ENCOUNTER — Other Ambulatory Visit: Payer: Self-pay

## 2021-07-02 ENCOUNTER — Ambulatory Visit (INDEPENDENT_AMBULATORY_CARE_PROVIDER_SITE_OTHER): Payer: Medicare Other | Admitting: Dermatology

## 2021-07-02 DIAGNOSIS — L814 Other melanin hyperpigmentation: Secondary | ICD-10-CM

## 2021-07-02 DIAGNOSIS — Z872 Personal history of diseases of the skin and subcutaneous tissue: Secondary | ICD-10-CM | POA: Diagnosis not present

## 2021-07-02 DIAGNOSIS — L578 Other skin changes due to chronic exposure to nonionizing radiation: Secondary | ICD-10-CM | POA: Diagnosis not present

## 2021-07-02 DIAGNOSIS — Z1283 Encounter for screening for malignant neoplasm of skin: Secondary | ICD-10-CM

## 2021-07-02 DIAGNOSIS — L821 Other seborrheic keratosis: Secondary | ICD-10-CM

## 2021-07-02 NOTE — Progress Notes (Signed)
° °  Follow-Up Visit   Subjective  Billy Wells is a 70 y.o. male who presents for the following: Follow-up (3 months f/u hx of Aks ). The patient has spots, moles and lesions to be evaluated, some may be new or changing and the patient has concerns that these could be cancer.   The following portions of the chart were reviewed this encounter and updated as appropriate:   Tobacco   Allergies   Meds   Problems   Med Hx   Surg Hx   Fam Hx      Review of Systems:  No other skin or systemic complaints except as noted in HPI or Assessment and Plan.  Objective  Well appearing patient in no apparent distress; mood and affect are within normal limits.  A full examination was performed including scalp, head, eyes, ears, nose, lips, neck, chest, axillae, abdomen, back, buttocks, bilateral upper extremities, bilateral lower extremities, hands, feet, fingers, toes, fingernails, and toenails. All findings within normal limits unless otherwise noted below.  face Clear today    Assessment & Plan  History of actinic keratosis face Clear today. Observe   Skin cancer screening  Actinic Damage - chronic, secondary to cumulative UV radiation exposure/sun exposure over time - diffuse scaly erythematous macules with underlying dyspigmentation - Recommend daily broad spectrum sunscreen SPF 30+ to sun-exposed areas, reapply every 2 hours as needed.  - Recommend staying in the shade or wearing long sleeves, sun glasses (UVA+UVB protection) and wide brim hats (4-inch brim around the entire circumference of the hat). - Call for new or changing lesions.   Seborrheic Keratoses - Stuck-on, waxy, tan-brown papules and/or plaques  - Benign-appearing - Discussed benign etiology and prognosis. - Observe - Call for any changes  Lentigines - Scattered tan macules - Due to sun exposure - Benign-appering, observe - Recommend daily broad spectrum sunscreen SPF 30+ to sun-exposed areas, reapply every 2 hours  as needed. - Call for any changes    Return in about 1 year (around 07/02/2022) for Aks.  IMarye Round, CMA, am acting as scribe for Sarina Ser, MD .  Documentation: I have reviewed the above documentation for accuracy and completeness, and I agree with the above.  Sarina Ser, MD

## 2021-07-02 NOTE — Patient Instructions (Signed)

## 2021-07-07 ENCOUNTER — Encounter: Payer: Self-pay | Admitting: Dermatology

## 2021-08-11 ENCOUNTER — Encounter: Payer: Medicare Other | Admitting: Dermatology

## 2022-01-27 ENCOUNTER — Ambulatory Visit (INDEPENDENT_AMBULATORY_CARE_PROVIDER_SITE_OTHER): Payer: Medicare Other | Admitting: Dermatology

## 2022-01-27 DIAGNOSIS — L570 Actinic keratosis: Secondary | ICD-10-CM | POA: Diagnosis not present

## 2022-01-27 DIAGNOSIS — L98499 Non-pressure chronic ulcer of skin of other sites with unspecified severity: Secondary | ICD-10-CM

## 2022-01-27 NOTE — Patient Instructions (Addendum)
Cryotherapy Aftercare  Wash gently with soap and water everyday.   Apply Vaseline and Band-Aid daily until healed.   Recommend gentle cleansing of area on left arm followed by Vaseline or antibiotic ointment, non-stick pad, and wrap once daily until healed.   Due to recent changes in healthcare laws, you may see results of your pathology and/or laboratory studies on MyChart before the doctors have had a chance to review them. We understand that in some cases there may be results that are confusing or concerning to you. Please understand that not all results are received at the same time and often the doctors may need to interpret multiple results in order to provide you with the best plan of care or course of treatment. Therefore, we ask that you please give [place] 2 business days to thoroughly review all your results before contacting the office for clarification. Should we see a critical lab result, you will be contacted sooner.   If You Need Anything After Your Visit  If you have any questions or concerns for your doctor, please call our main line at [phone] and press option 4 to reach your doctor's medical assistant. If no one answers, please leave a voicemail as directed and we will return your call as soon as possible. Messages left after 4 pm will be answered the following business day.   You may also send [place] a message via [place]. We typically respond to MyChart messages within 1-2 business days.  For prescription refills, please ask your pharmacy to contact our office. Our fax number is [phone].  If you have an urgent issue when the clinic is closed that cannot wait until the next business day, you can page your doctor at the number below.    Please note that while we do our best to be available for urgent issues outside of office hours, we are not available 24/7.   If you have an urgent issue and are unable to reach [place], you may choose to seek medical care at your doctor's  office, retail clinic, urgent care center, or emergency room.  If you have a medical emergency, please immediately call 911 or go to the emergency department.  Pager Numbers  - Dr. Nehemiah Massed: [phone]  - Dr. Laurence Ferrari: [phone]  - Dr. Nicole Kindred: [phone]  In the event of inclement weather, please call our main line at [phone] for an update on the status of any delays or closures.  Dermatology Medication Tips: Please keep the boxes that topical medications come in in order to help keep track of the instructions about where and how to use these. Pharmacies typically print the medication instructions only on the boxes and not directly on the medication tubes.   If your medication is too expensive, please contact our office at [phone] option 4 or send [place] a message through [place].   We are unable to tell what your co-pay for medications will be in advance as this is different depending on your insurance coverage. However, we may be able to find a substitute medication at lower cost or fill out paperwork to get insurance to cover a needed medication.   If a prior authorization is required to get your medication covered by your insurance company, please allow [place] 1-2 business days to complete this process.  Drug prices often vary depending on where the prescription is filled and some pharmacies may offer cheaper prices.  The website www.goodrx.com contains coupons for medications through different pharmacies. The prices here do not  account for what the cost may be with help from insurance (it may be cheaper with your insurance), but the website can give you the price if you did not use any insurance.  - You can print the associated coupon and take it with your prescription to the pharmacy.  - You may also stop by our office during regular business hours and pick up a GoodRx coupon card.  - If you need your prescription sent electronically to a different pharmacy, notify our office  through [facility] or by phone at [phone] option 4.     Si Usted Necesita Algo Despus de Su Visita  Tambin puede enviarnos un mensaje a travs de Pharmacist, community. Por lo general respondemos a los mensajes de MyChart en el transcurso de 1 a 2 das hbiles.  Para renovar recetas, por favor pida a su farmacia que se ponga en contacto con nuestra oficina. Harland Dingwall de fax es [place] [phone].  Si tiene un asunto urgente cuando la clnica est cerrada y que no puede esperar hasta el siguiente da hbil, puede llamar/localizar a su doctor(a) al nmero que aparece a continuacin.   Por favor, tenga en cuenta que aunque hacemos todo lo posible para estar disponibles para asuntos urgentes fuera del horario de [place], no estamos disponibles las 24 horas del da, los 7 das de la Between.   Si tiene un problema urgente y no puede comunicarse con nosotros, puede optar por buscar atencin mdica  en el consultorio de su doctor(a), en una clnica privada, en un centro de atencin urgente o en una sala de emergencias.  Si tiene Engineering geologist, por favor llame inmediatamente al 911 o vaya a la sala de emergencias.  Nmeros de bper  - Dr. Nehemiah Massed: [phone]  - Dra. Moye: [phone]  - Dra. Nicole Kindred: [phone]  En caso de inclemencias del [place], por favor llame a Johnsie Kindred principal al [phone] para una actualizacin sobre el [place] de cualquier retraso o cierre.  Consejos para la medicacin en dermatologa: Por favor, guarde las cajas en las que vienen los medicamentos de uso tpico para ayudarle a seguir las instrucciones sobre dnde y cmo usarlos. Las farmacias generalmente imprimen las instrucciones del medicamento slo en las cajas y no directamente en los tubos del [place].   Si su medicamento es muy caro, por favor, pngase en contacto con Zigmund Daniel llamando al [phone] y presione la opcin 4 o envenos un mensaje a travs de Pharmacist, community.   No  podemos decirle cul ser su copago por los medicamentos por adelantado ya que esto es diferente dependiendo de la cobertura de su seguro. Sin embargo, es posible que podamos encontrar un medicamento sustituto a Electrical engineer un formulario para que el seguro cubra el medicamento que se considera necesario.   Si se requiere una autorizacin previa para que su compaa de seguros [place] su medicamento, por favor permtanos de 1 a 2 das hbiles para completar este proceso.  Los precios de los medicamentos varan con frecuencia dependiendo del Environmental consultant de dnde se surte la receta y alguna farmacias pueden ofrecer precios ms baratos.  El sitio web www.goodrx.com tiene cupones para medicamentos de Airline pilot. Los precios aqu no tienen en cuenta lo que podra costar con la ayuda del seguro (puede ser ms barato con su seguro), pero el sitio web puede darle el precio si no utiliz Research scientist (physical sciences).  - Puede imprimir el cupn correspondiente y llevarlo con su receta a la farmacia.  -  Tambin puede pasar por nuestra oficina durante el horario de atencin regular y Charity fundraiser una tarjeta de cupones de GoodRx.  - Si necesita que su receta se enve electrnicamente a una farmacia diferente, informe a nuestra oficina a travs de MyChart de [place] o por telfono llamando al [phone] y presione la opcin 4.

## 2022-01-27 NOTE — Progress Notes (Signed)
   Follow-Up Visit   Subjective  Billy Wells is a 71 y.o. male who presents for the following: Spot (Right forearm x 2 months. Scabbed up and hasn't gone away.).  Also had fall about a week ago and has wound on L arm he would like checked.  The following portions of the chart were reviewed this encounter and updated as appropriate:       Review of Systems:  No other skin or systemic complaints except as noted in HPI or Assessment and Plan.  Objective  Well appearing patient in no apparent distress; mood and affect are within normal limits.  A focused examination was performed including right forearm. Relevant physical exam findings are noted in the Assessment and Plan.  R forearm x 2 (2) Keratotic papules x 2   Left Upper Arm - Anterior Ulceration of the left elbow 3.5 cm    Assessment & Plan  Hypertrophic actinic keratosis (2) R forearm x 2  vs Inflamed SK   Actinic keratoses are precancerous spots that appear secondary to cumulative UV radiation exposure/sun exposure over time. They are chronic with expected duration over 1 year. A portion of actinic keratoses will progress to squamous cell carcinoma of the skin. It is not possible to reliably predict which spots will progress to skin cancer and so treatment is recommended to prevent development of skin cancer.  Recommend daily broad spectrum sunscreen SPF 30+ to sun-exposed areas, reapply every 2 hours as needed.  Recommend staying in the shade or wearing long sleeves, sun glasses (UVA+UVB protection) and wide brim hats (4-inch brim around the entire circumference of the hat). Call for new or changing lesions.  Destruction of lesion - R forearm x 2  Destruction method: cryotherapy   Informed consent: discussed and consent obtained   Lesion destroyed using liquid nitrogen: Yes   Region frozen until ice ball extended beyond lesion: Yes   Outcome: patient tolerated procedure well with no complications   Post-procedure  details: wound care instructions given   Additional details:  Prior to procedure, discussed risks of blister formation, small wound, skin dyspigmentation, or rare scar following cryotherapy. Recommend Vaseline ointment to treated areas while healing.   Skin ulcer, unspecified ulcer stage (HCC) Left Upper Arm - Anterior  Traumatic, from fall.  Recommend gentle cleansing followed by Vaseline or OTC antibiotic ointment, non-stick pad and wrap daily until healed.   Wound cleansed with Puracyn, followed by Mupirocin 2% ointment, Telfa, and Coban wrap today.     Return if symptoms worsen or fail to improve.  IJamesetta Orleans, CMA, am acting as scribe for Brendolyn Patty, MD .  Documentation: I have reviewed the above documentation for accuracy and completeness, and I agree with the above.  Brendolyn Patty MD

## 2022-07-05 ENCOUNTER — Ambulatory Visit: Payer: Medicare Other | Admitting: Dermatology

## 2022-07-07 ENCOUNTER — Ambulatory Visit: Payer: Medicare Other | Admitting: Dermatology

## 2022-11-09 ENCOUNTER — Encounter: Payer: Self-pay | Admitting: Dermatology

## 2022-11-09 ENCOUNTER — Ambulatory Visit (INDEPENDENT_AMBULATORY_CARE_PROVIDER_SITE_OTHER): Payer: Medicare Other | Admitting: Dermatology

## 2022-11-09 VITALS — BP 139/81

## 2022-11-09 DIAGNOSIS — L578 Other skin changes due to chronic exposure to nonionizing radiation: Secondary | ICD-10-CM

## 2022-11-09 DIAGNOSIS — L570 Actinic keratosis: Secondary | ICD-10-CM | POA: Diagnosis not present

## 2022-11-09 DIAGNOSIS — L814 Other melanin hyperpigmentation: Secondary | ICD-10-CM | POA: Diagnosis not present

## 2022-11-09 DIAGNOSIS — Z1283 Encounter for screening for malignant neoplasm of skin: Secondary | ICD-10-CM | POA: Diagnosis not present

## 2022-11-09 DIAGNOSIS — D229 Melanocytic nevi, unspecified: Secondary | ICD-10-CM

## 2022-11-09 DIAGNOSIS — L821 Other seborrheic keratosis: Secondary | ICD-10-CM

## 2022-11-09 DIAGNOSIS — L905 Scar conditions and fibrosis of skin: Secondary | ICD-10-CM

## 2022-11-09 MED ORDER — TRIAMCINOLONE ACETONIDE 0.1 % EX CREA: 1.0000 | TOPICAL_CREAM | CUTANEOUS | 1 refills | Status: AC

## 2022-11-09 NOTE — Progress Notes (Signed)
   Follow-Up Visit   Subjective  Billy Wells is a 72 y.o. male who presents for the following: Skin Cancer Screening and Full Body Skin Exam, hx of Aks, scar L arm ~38m, itchy, hx of trauma to that area The patient presents for Total-Body Skin Exam (TBSE) for skin cancer screening and mole check. The patient has spots, moles and lesions to be evaluated, some may be new or changing and the patient has concerns that these could be cancer.  The following portions of the chart were reviewed this encounter and updated as appropriate: medications, allergies, medical history  Review of Systems:  No other skin or systemic complaints except as noted in HPI or Assessment and Plan.  Objective  Well appearing patient in no apparent distress; mood and affect are within normal limits.  A full examination was performed including scalp, head, eyes, ears, nose, lips, neck, chest, axillae, abdomen, back, buttocks, bilateral upper extremities, bilateral lower extremities, hands, feet, fingers, toes, fingernails, and toenails. All findings within normal limits unless otherwise noted below.   Relevant physical exam findings are noted in the Assessment and Plan.  Nose x 1 Pink scaly macules    Assessment & Plan   LENTIGINES, SEBORRHEIC KERATOSES, HEMANGIOMAS - Benign normal skin lesions - Benign-appearing - Call for any changes  MELANOCYTIC NEVI - Tan-brown and/or pink-flesh-colored symmetric macules and papules - Benign appearing on exam today - Observation - Call clinic for new or changing moles - Recommend daily use of broad spectrum spf 30+ sunscreen to sun-exposed areas.   ACTINIC DAMAGE - Chronic condition, secondary to cumulative UV/sun exposure - diffuse scaly erythematous macules with underlying dyspigmentation - Recommend daily broad spectrum sunscreen SPF 30+ to sun-exposed areas, reapply every 2 hours as needed.  - Staying in the shade or wearing long sleeves, sun glasses (UVA+UVB  protection) and wide brim hats (4-inch brim around the entire circumference of the hat) are also recommended for sun protection.  - Call for new or changing lesions.  SKIN CANCER SCREENING PERFORMED TODAY.  SCAR L upper arm, secondary to traumatic fall Exam:  2.5 x 1.0cm pink thickened plaque  Treatment: Start TMC 0.1% cr qd 5d/wk for 2 months, avoid f/g/a Discussed IL injections in future if needed  Topical steroids (such as triamcinolone, fluocinolone, fluocinonide, mometasone, clobetasol, halobetasol, betamethasone, hydrocortisone) can cause thinning and lightening of the skin if they are used for too long in the same area. Your physician has selected the right strength medicine for your problem and area affected on the body. Please use your medication only as directed by your physician to prevent side effects.    AK (actinic keratosis) Nose x 1  Destruction of lesion - Nose x 1 Complexity: simple   Destruction method: cryotherapy   Informed consent: discussed and consent obtained   Timeout:  patient name, date of birth, surgical site, and procedure verified Lesion destroyed using liquid nitrogen: Yes   Region frozen until ice ball extended beyond lesion: Yes   Outcome: patient tolerated procedure well with no complications   Post-procedure details: wound care instructions given     Return in about 4 months (around 03/11/2023) for recheck scar L upper arm.  I, Ardis Rowan, RMA, am acting as scribe for Armida Sans, MD .  Documentation: I have reviewed the above documentation for accuracy and completeness, and I agree with the above.  Armida Sans, MD

## 2022-11-09 NOTE — Patient Instructions (Addendum)
Cryotherapy Aftercare  Wash gently with soap and water everyday.   Apply Vaseline and Band-Aid daily until healed.     Due to recent changes in healthcare laws, you may see results of your pathology and/or laboratory studies on MyChart before the doctors have had a chance to review them. We understand that in some cases there may be results that are confusing or concerning to you. Please understand that not all results are received at the same time and often the doctors may need to interpret multiple results in order to provide you with the best plan of care or course of treatment. Therefore, we ask that you please give us 2 business days to thoroughly review all your results before contacting the office for clarification. Should we see a critical lab result, you will be contacted sooner.   If You Need Anything After Your Visit  If you have any questions or concerns for your doctor, please call our main line at [phone] and press option 4 to reach your doctor's medical assistant. If no one answers, please leave a voicemail as directed and we will return your call as soon as possible. Messages left after 4 pm will be answered the following business day.   You may also send us a message via MyChart. We typically respond to MyChart messages within 1-2 business days.  For prescription refills, please ask your pharmacy to contact our office. Our fax number is [phone].  If you have an urgent issue when the clinic is closed that cannot wait until the next business day, you can page your doctor at the number below.    Please note that while we do our best to be available for urgent issues outside of office hours, we are not available 24/7.   If you have an urgent issue and are unable to reach us, you may choose to seek medical care at your doctor's office, retail clinic, urgent care center, or emergency room.  If you have a medical emergency, please immediately call 911 or go to the  emergency department.  Pager Numbers  - Dr. Kowalski: [phone]  - Dr. Moye: [phone]  - Dr. Stewart: [phone]  In the event of inclement weather, please call our main line at [phone] for an update on the status of any delays or closures.  Dermatology Medication Tips: Please keep the boxes that topical medications come in in order to help keep track of the instructions about where and how to use these. Pharmacies typically print the medication instructions only on the boxes and not directly on the medication tubes.   If your medication is too expensive, please contact our office at [phone] option 4 or send us a message through MyChart.   We are unable to tell what your co-pay for medications will be in advance as this is different depending on your insurance coverage. However, we may be able to find a substitute medication at lower cost or fill out paperwork to get insurance to cover a needed medication.   If a prior authorization is required to get your medication covered by your insurance company, please allow us 1-2 business days to complete this process.  Drug prices often vary depending on where the prescription is filled and some pharmacies may offer cheaper prices.  The website www.goodrx.com contains coupons for medications through different pharmacies. The prices here do not account for what the cost may be with help from insurance (it may be cheaper with your insurance), but the website can   give you the price if you did not use any insurance.  - You can print the associated coupon and take it with your prescription to the pharmacy.  - You may also stop by our office during regular business hours and pick up a GoodRx coupon card.  - If you need your prescription sent electronically to a different pharmacy, notify our office through [facility] MyChart or by phone at [phone] option 4.     Si Usted Necesita Algo Despus de Su Visita  Tambin puede  enviarnos un mensaje a travs de MyChart. Por lo general respondemos a los mensajes de MyChart en el transcurso de 1 a 2 das hbiles.  Para renovar recetas, por favor pida a su farmacia que se ponga en contacto con nuestra oficina. Nuestro nmero de fax es el [phone].  Si tiene un asunto urgente cuando la clnica est cerrada y que no puede esperar hasta el siguiente da hbil, puede llamar/localizar a su doctor(a) al nmero que aparece a continuacin.   Por favor, tenga en cuenta que aunque hacemos todo lo posible para estar disponibles para asuntos urgentes fuera del horario de oficina, no estamos disponibles las 24 horas del da, los 7 das de la semana.   Si tiene un problema urgente y no puede comunicarse con nosotros, puede optar por buscar atencin mdica  en el consultorio de su doctor(a), en una clnica privada, en un centro de atencin urgente o en una sala de emergencias.  Si tiene una emergencia mdica, por favor llame inmediatamente al 911 o vaya a la sala de emergencias.  Nmeros de bper  - Dr. Kowalski: [phone]  - Dra. Moye: [phone]  - Dra. Stewart: [phone]  En caso de inclemencias del tiempo, por favor llame a nuestra lnea principal al [phone] para una actualizacin sobre el estado de cualquier retraso o cierre.  Consejos para la medicacin en dermatologa: Por favor, guarde las cajas en las que vienen los medicamentos de uso tpico para ayudarle a seguir las instrucciones sobre dnde y cmo usarlos. Las farmacias generalmente imprimen las instrucciones del medicamento slo en las cajas y no directamente en los tubos del medicamento.   Si su medicamento es muy caro, por favor, pngase en contacto con nuestra oficina llamando al [phone] y presione la opcin 4 o envenos un mensaje a travs de MyChart.   No podemos decirle cul ser su copago por los medicamentos por adelantado ya que esto es diferente dependiendo de la cobertura de su seguro.  Sin embargo, es posible que podamos encontrar un medicamento sustituto a menor costo o llenar un formulario para que el seguro cubra el medicamento que se considera necesario.   Si se requiere una autorizacin previa para que su compaa de seguros cubra su medicamento, por favor permtanos de 1 a 2 das hbiles para completar este proceso.  Los precios de los medicamentos varan con frecuencia dependiendo del lugar de dnde se surte la receta y alguna farmacias pueden ofrecer precios ms baratos.  El sitio web www.goodrx.com tiene cupones para medicamentos de diferentes farmacias. Los precios aqu no tienen en cuenta lo que podra costar con la ayuda del seguro (puede ser ms barato con su seguro), pero el sitio web puede darle el precio si no utiliz ningn seguro.  - Puede imprimir el cupn correspondiente y llevarlo con su receta a la farmacia.  - Tambin puede pasar por nuestra oficina durante el horario de atencin regular y recoger una tarjeta de cupones de GoodRx.  -   Si necesita que su receta se enve electrnicamente a una farmacia diferente, informe a nuestra oficina a travs de MyChart de [place] o por telfono llamando al [phone] y presione la opcin 4.  

## 2022-11-16 ENCOUNTER — Encounter: Payer: Self-pay | Admitting: Dermatology

## 2022-11-24 ENCOUNTER — Encounter: Payer: Self-pay | Admitting: Internal Medicine

## 2022-11-30 ENCOUNTER — Encounter: Payer: Self-pay | Admitting: Internal Medicine

## 2022-11-30 NOTE — Anesthesia Preprocedure Evaluation (Signed)
Anesthesia Evaluation  Patient identified by MRN, date of birth, ID band Patient awake    Reviewed: Allergy & Precautions, H&P , NPO status , Patient's Chart, lab work & pertinent test results  Airway Mallampati: II  TM Distance: >3 FB Neck ROM: full    Dental no notable dental hx.    Pulmonary neg pulmonary ROS   Pulmonary exam normal        Cardiovascular hypertension, Normal cardiovascular exam     Neuro/Psych negative neurological ROS  negative psych ROS   GI/Hepatic negative GI ROS, Neg liver ROS,,,  Endo/Other  negative endocrine ROS    Renal/GU negative Renal ROS  negative genitourinary   Musculoskeletal   Abdominal Normal abdominal exam  (+)   Peds  Hematology negative hematology ROS (+)   Anesthesia Other Findings Past Medical History: No date: Actinic keratosis 2019: Broken foot, left, closed, initial encounter No date: Cancer (HCC)     Comment:  prostate with radiation No date: Hyperlipidemia No date: Hypertension  Past Surgical History: No date: COLON SURGERY     Comment:  twisted colon No date: COLONOSCOPY     Comment:  2010, 2013, 2018     Reproductive/Obstetrics negative OB ROS                             Anesthesia Physical Anesthesia Plan  ASA: 2  Anesthesia Plan: General   Post-op Pain Management:    Induction: Intravenous  PONV Risk Score and Plan: Propofol infusion and TIVA  Airway Management Planned: Natural Airway  Additional Equipment:   Intra-op Plan:   Post-operative Plan:   Informed Consent: I have reviewed the patients History and Physical, chart, labs and discussed the procedure including the risks, benefits and alternatives for the proposed anesthesia with the patient or authorized representative who has indicated his/her understanding and acceptance.     Dental Advisory Given  Plan Discussed with: CRNA and Surgeon  Anesthesia  Plan Comments:         Anesthesia Quick Evaluation

## 2022-12-01 ENCOUNTER — Other Ambulatory Visit: Payer: Self-pay

## 2022-12-01 ENCOUNTER — Ambulatory Visit: Payer: Medicare Other | Admitting: Anesthesiology

## 2022-12-01 ENCOUNTER — Ambulatory Visit
Admission: RE | Admit: 2022-12-01 | Discharge: 2022-12-01 | Disposition: A | Discharge status: Home / Self Care | Payer: Medicare Other | Attending: Internal Medicine | Admitting: Internal Medicine

## 2022-12-01 ENCOUNTER — Encounter: Payer: Self-pay | Admitting: Internal Medicine

## 2022-12-01 ENCOUNTER — Encounter
Admission: RE | Disposition: A | Discharge status: Home / Self Care | Payer: Self-pay | Source: Home / Self Care | Attending: Internal Medicine

## 2022-12-01 DIAGNOSIS — Z1211 Encounter for screening for malignant neoplasm of colon: Secondary | ICD-10-CM | POA: Insufficient documentation

## 2022-12-01 DIAGNOSIS — K640 First degree hemorrhoids: Secondary | ICD-10-CM | POA: Diagnosis not present

## 2022-12-01 DIAGNOSIS — Z8546 Personal history of malignant neoplasm of prostate: Secondary | ICD-10-CM | POA: Diagnosis not present

## 2022-12-01 DIAGNOSIS — I10 Essential (primary) hypertension: Secondary | ICD-10-CM | POA: Insufficient documentation

## 2022-12-01 DIAGNOSIS — Z923 Personal history of irradiation: Secondary | ICD-10-CM | POA: Diagnosis not present

## 2022-12-01 DIAGNOSIS — K635 Polyp of colon: Secondary | ICD-10-CM | POA: Diagnosis not present

## 2022-12-01 DIAGNOSIS — D125 Benign neoplasm of sigmoid colon: Secondary | ICD-10-CM | POA: Diagnosis not present

## 2022-12-01 HISTORY — DX: Malignant (primary) neoplasm, unspecified: C80.1

## 2022-12-01 HISTORY — DX: Essential (primary) hypertension: I10

## 2022-12-01 HISTORY — DX: Hyperlipidemia, unspecified: E78.5

## 2022-12-01 HISTORY — PX: COLONOSCOPY: SHX5424

## 2022-12-01 SURGERY — COLONOSCOPY
Anesthesia: General

## 2022-12-01 MED ORDER — EPHEDRINE 5 MG/ML INJ
INTRAVENOUS | Status: AC
  Filled 2022-12-01: qty 5

## 2022-12-01 MED ORDER — LIDOCAINE HCL (PF) 2 % IJ SOLN
INTRAMUSCULAR | Status: AC
  Filled 2022-12-01: qty 5

## 2022-12-01 MED ORDER — PROPOFOL 10 MG/ML IV BOLUS
INTRAVENOUS | Status: AC
  Filled 2022-12-01: qty 20

## 2022-12-01 MED ORDER — SODIUM CHLORIDE 0.9 % IV SOLN: INTRAVENOUS | Status: DC

## 2022-12-01 MED ORDER — PROPOFOL 10 MG/ML IV BOLUS
INTRAVENOUS | Status: DC | PRN
  Administered 2022-12-01 (×2): 50 mg via INTRAVENOUS

## 2022-12-01 MED ORDER — EPHEDRINE SULFATE (PRESSORS) 50 MG/ML IJ SOLN
INTRAMUSCULAR | Status: DC | PRN
  Administered 2022-12-01: 10 mg via INTRAVENOUS

## 2022-12-01 MED ORDER — PROPOFOL 500 MG/50ML IV EMUL
INTRAVENOUS | Status: DC | PRN
  Administered 2022-12-01: 100 ug/kg/min via INTRAVENOUS

## 2022-12-01 MED ORDER — LIDOCAINE HCL (CARDIAC) PF 100 MG/5ML IV SOSY
PREFILLED_SYRINGE | INTRAVENOUS | Status: DC | PRN
  Administered 2022-12-01: 40 mg via INTRAVENOUS

## 2022-12-01 NOTE — Transfer of Care (Signed)
Immediate Anesthesia Transfer of Care Note  Patient: Billy Wells  Procedure(s) Performed: COLONOSCOPY  Patient Location: Endoscopy Unit  Anesthesia Type:General  Level of Consciousness: drowsy and patient cooperative  Airway & Oxygen Therapy: Patient Spontanous Breathing and Patient connected to nasal cannula oxygen  Post-op Assessment: Report given to RN and Patient moving all extremities X 4  Post vital signs: Reviewed and stable  Last Vitals:  Vitals Value Taken Time  BP 91/75 12/01/22 1259  Temp 36.4 C 12/01/22 1256  Pulse 76 12/01/22 1300  Resp 19 12/01/22 1300  SpO2 97 % 12/01/22 1300  Vitals shown include unvalidated device data.  Last Pain:  Vitals:   12/01/22 1259  TempSrc:   PainSc: 0-No pain         Complications: No notable events documented.

## 2022-12-01 NOTE — Op Note (Signed)
[facility] Patient Name: Billy Wells Procedure Date: 12/01/2022 12:18 PM MRN: [id number] Account #: [id number] Date of Birth: 1951/06/17 Admit Type: Outpatient Age: 72 Room: [facility] ENDO ROOM 2 Gender: Male Note Status: Finalized Instrument Name: Prentice Docker [id number] Procedure:             Colonoscopy Indications:           High risk colon cancer surveillance: Personal history                         of non-advanced adenoma Providers:             Boykin Nearing. Norma Fredrickson MD, MD Referring MD:          Clydie Braun (Referring MD) Medicines:             Propofol per Anesthesia Complications:         No immediate complications. Procedure:             Pre-Anesthesia Assessment:                        - The risks and benefits of the procedure and the                         sedation options and risks were discussed with the                         patient. All questions were answered and informed                         consent was obtained.                        - Patient identification and proposed procedure were                         verified prior to the procedure by the nurse. The                         procedure was verified in the procedure room.                        - ASA Grade Assessment: III - A patient with severe                         systemic disease.                        - After reviewing the risks and benefits, the patient                         was deemed in satisfactory condition to undergo the                         procedure.                        After obtaining informed consent, the colonoscope was                         passed under direct vision.  Throughout the procedure,                         the patient's blood pressure, pulse, and oxygen                         saturations were monitored continuously. The                         Colonoscope was introduced through the anus and                         advanced to  the the cecum, identified by appendiceal                         orifice and ileocecal valve. The colonoscopy was                         performed without difficulty. The patient tolerated                         the procedure well. The quality of the bowel                         preparation was adequate. The ileocecal valve,                         appendiceal orifice, and rectum were photographed. Findings:      The perianal and digital rectal examinations were normal. Pertinent       negatives include normal sphincter tone and no palpable rectal lesions.      Non-bleeding internal hemorrhoids were found during retroflexion. The       hemorrhoids were Grade I (internal hemorrhoids that do not prolapse).      A 7 mm polyp was found in the descending colon. The polyp was sessile.       The polyp was removed with a cold snare. Resection and retrieval were       complete.      Two sessile polyps were found in the proximal sigmoid colon. The polyps       were 3 to 4 mm in size. These polyps were removed with a cold biopsy       forceps. Resection and retrieval were complete.      The exam was otherwise without abnormality. Impression:            - Non-bleeding internal hemorrhoids.                        - One 7 mm polyp in the descending colon, removed with                         a cold snare. Resected and retrieved.                        - Two 3 to 4 mm polyps in the proximal sigmoid colon,                         removed with a cold biopsy forceps. Resected and  retrieved.                        - The examination was otherwise normal. Recommendation:        - Patient has a contact number available for                         emergencies. The signs and symptoms of potential                         delayed complications were discussed with the patient.                         Return to normal activities tomorrow. Written                         discharge  instructions were provided to the patient.                        - Resume previous diet.                        - Continue present medications.                        - Repeat colonoscopy is recommended for surveillance.                         The colonoscopy date will be determined after                         pathology results from today's exam become available                         for review.                        - Return to GI office PRN.                        - The findings and recommendations were discussed with                         the patient. Procedure Code(s):     --- Professional ---                        [phone], Colonoscopy, flexible; with removal of                         tumor(s), polyp(s), or other lesion(s) by snare                         technique                        45380, 59, Colonoscopy, flexible; with biopsy, single                         or multiple Diagnosis Code(s):     --- Professional ---  K64.0, First degree hemorrhoids                        D12.5, Benign neoplasm of sigmoid colon                        D12.4, Benign neoplasm of descending colon                        Z86.010, Personal history of colonic polyps CPT copyright 2022 American Medical Association. All rights reserved. The codes documented in this report are preliminary and upon coder review may  be revised to meet current compliance requirements. Stanton Kidney MD, MD 12/01/2022 1:02:09 PM This report has been signed electronically. Number of Addenda: 0 Note Initiated On: 12/01/2022 12:18 PM Scope Withdrawal Time: 0 hours 7 minutes 21 seconds  Total Procedure Duration: 0 hours 14 minutes 30 seconds  Estimated Blood Loss:  Estimated blood loss: none.      [facility]

## 2022-12-01 NOTE — Interval H&P Note (Signed)
History and Physical Interval Note:  12/01/2022 11:48 AM  Billy Wells  has presented today for surgery, with the diagnosis of Personal history of colonic polyps (Z86.010).  The various methods of treatment have been discussed with the patient and family. After consideration of risks, benefits and other options for treatment, the patient has consented to  Procedure(s): COLONOSCOPY (N/A) as a surgical intervention.  The patient's history has been reviewed, patient examined, no change in status, stable for surgery.  I have reviewed the patient's chart and labs.  Questions were answered to the patient's satisfaction.     [place], [place]

## 2022-12-01 NOTE — Anesthesia Procedure Notes (Signed)
Procedure Name: General with mask airway Date/Time: 12/01/2022 12:34 PM  Performed by: Lily Lovings, CRNAPre-anesthesia Checklist: Patient identified, Suction available, Emergency Drugs available, Patient being monitored and Timeout performed Patient Re-evaluated:Patient Re-evaluated prior to induction Oxygen Delivery Method: Nasal cannula Preoxygenation: Pre-oxygenation with 100% oxygen Induction Type: IV induction

## 2022-12-01 NOTE — H&P (Signed)
  Outpatient short stay form Pre-procedure 12/01/2022 11:47 AM Ailanie Ruttan K. Norma Fredrickson, M.D.  Primary Physician: Clydie Braun, M.D.  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  History of present illness:                            Patient presents for colonoscopy for a personal hx of colon polyps. The patient denies abdominal pain, abnormal weight loss or rectal bleeding.      Current Facility-Administered Medications:    0.9 %  sodium chloride infusion, , Intravenous, Continuous, [place], Boykin Nearing, MD, Last Rate: 20 mL/hr at 12/01/22 1124, New Bag at 12/01/22 1124  Medications Prior to Admission  Medication Sig Dispense Refill Last Dose   amLODipine (NORVASC) 10 MG tablet Take 1 tablet by mouth daily.   12/01/2022 at 0600   Saw Palmetto 450 MG CAPS Take by mouth.   Past Week   tamsulosin (FLOMAX) 0.4 MG CAPS capsule Take 0.4 mg by mouth daily.   11/30/2022   triamcinolone cream (KENALOG) 0.1 % Apply 1 Application topically as directed. Qd 5 days a week to scar Left arm until returns to clinic, avoid face, groin, axilla 45 g 1 11/30/2022     No Known Allergies   Past Medical History:  Diagnosis Date   Actinic keratosis    Broken foot, left, closed, initial encounter 2019   Cancer [facility])    prostate with radiation   Hyperlipidemia    Hypertension     Review of systems:  Otherwise negative.    Physical Exam  Gen: Alert, oriented. Appears stated age.  HEENT: [place]/AT. PERRLA. Lungs: CTA, no wheezes. CV: RR nl S1, S2. Abd: soft, benign, no masses. BS+ Ext: No edema. Pulses 2+    Planned procedures: Proceed with colonoscopy. The patient understands the nature of the planned procedure, indications, risks, alternatives and potential complications including but not limited to bleeding, infection, perforation, damage to internal organs and possible oversedation/side effects from anesthesia. The patient agrees and gives consent to proceed.   Please refer to procedure notes for findings, recommendations and patient disposition/instructions.     Shanik [place] K. Norma Fredrickson, M.D. Gastroenterology 12/01/2022  11:47 AM

## 2022-12-01 NOTE — Anesthesia Postprocedure Evaluation (Signed)
Anesthesia Post Note  Patient: Billy Wells  Procedure(s) Performed: COLONOSCOPY  Patient location during evaluation: Endoscopy Anesthesia Type: General Level of consciousness: awake and alert Pain management: pain level controlled Vital Signs Assessment: post-procedure vital signs reviewed and stable Respiratory status: spontaneous breathing, nonlabored ventilation and respiratory function stable Cardiovascular status: blood pressure returned to baseline and stable Postop Assessment: no apparent nausea or vomiting Anesthetic complications: no   No notable events documented.   Last Vitals:  Vitals:   12/01/22 1256 12/01/22 1259  BP: (!) 85/61 91/75  Pulse: (!) 57   Resp: (!) 57   Temp: (!) 36.4 C   SpO2: 97%     Last Pain:  Vitals:   12/01/22 1316  TempSrc:   PainSc: 0-No pain                 Foye Deer

## 2022-12-02 ENCOUNTER — Encounter: Payer: Self-pay | Admitting: Internal Medicine

## 2022-12-02 LAB — SURGICAL PATHOLOGY

## 2023-03-16 ENCOUNTER — Ambulatory Visit: Payer: BLUE CROSS/BLUE SHIELD | Admitting: Dermatology

## 2023-06-28 ENCOUNTER — Other Ambulatory Visit: Payer: Self-pay | Admitting: Infectious Diseases

## 2023-06-28 DIAGNOSIS — Z8249 Family history of ischemic heart disease and other diseases of the circulatory system: Secondary | ICD-10-CM

## 2023-06-28 DIAGNOSIS — I10 Essential (primary) hypertension: Secondary | ICD-10-CM

## 2023-06-28 DIAGNOSIS — I700 Atherosclerosis of aorta: Secondary | ICD-10-CM

## 2023-06-28 DIAGNOSIS — C61 Malignant neoplasm of prostate: Secondary | ICD-10-CM

## 2023-07-04 ENCOUNTER — Ambulatory Visit
Admission: RE | Admit: 2023-07-04 | Discharge: 2023-07-04 | Disposition: A | Discharge status: Home / Self Care | Payer: Self-pay | Source: Ambulatory Visit | Attending: Infectious Diseases | Admitting: Infectious Diseases

## 2023-07-04 DIAGNOSIS — I10 Essential (primary) hypertension: Secondary | ICD-10-CM | POA: Insufficient documentation

## 2023-07-04 DIAGNOSIS — C61 Malignant neoplasm of prostate: Secondary | ICD-10-CM | POA: Insufficient documentation

## 2023-07-04 DIAGNOSIS — Z8249 Family history of ischemic heart disease and other diseases of the circulatory system: Secondary | ICD-10-CM | POA: Insufficient documentation

## 2023-07-04 DIAGNOSIS — I700 Atherosclerosis of aorta: Secondary | ICD-10-CM | POA: Insufficient documentation

## 2023-08-04 ENCOUNTER — Ambulatory Visit: Payer: BLUE CROSS/BLUE SHIELD | Admitting: Dermatology

## 2023-08-25 ENCOUNTER — Ambulatory Visit: Payer: BLUE CROSS/BLUE SHIELD | Admitting: Dermatology

## 2023-12-05 ENCOUNTER — Ambulatory Visit: Payer: BLUE CROSS/BLUE SHIELD | Admitting: Dermatology

## 2023-12-07 ENCOUNTER — Ambulatory Visit: Admitting: Dermatology

## 2023-12-26 ENCOUNTER — Ambulatory Visit (INDEPENDENT_AMBULATORY_CARE_PROVIDER_SITE_OTHER): Admitting: Dermatology

## 2023-12-26 ENCOUNTER — Encounter: Payer: Self-pay | Admitting: Dermatology

## 2023-12-26 DIAGNOSIS — D1801 Hemangioma of skin and subcutaneous tissue: Secondary | ICD-10-CM

## 2023-12-26 DIAGNOSIS — D692 Other nonthrombocytopenic purpura: Secondary | ICD-10-CM

## 2023-12-26 DIAGNOSIS — L570 Actinic keratosis: Secondary | ICD-10-CM

## 2023-12-26 DIAGNOSIS — L719 Rosacea, unspecified: Secondary | ICD-10-CM

## 2023-12-26 DIAGNOSIS — L821 Other seborrheic keratosis: Secondary | ICD-10-CM

## 2023-12-26 DIAGNOSIS — W908XXA Exposure to other nonionizing radiation, initial encounter: Secondary | ICD-10-CM

## 2023-12-26 DIAGNOSIS — L578 Other skin changes due to chronic exposure to nonionizing radiation: Secondary | ICD-10-CM

## 2023-12-26 DIAGNOSIS — D229 Melanocytic nevi, unspecified: Secondary | ICD-10-CM

## 2023-12-26 DIAGNOSIS — Z1283 Encounter for screening for malignant neoplasm of skin: Secondary | ICD-10-CM

## 2023-12-26 DIAGNOSIS — L814 Other melanin hyperpigmentation: Secondary | ICD-10-CM

## 2023-12-26 DIAGNOSIS — Z872 Personal history of diseases of the skin and subcutaneous tissue: Secondary | ICD-10-CM

## 2023-12-26 MED ORDER — METRONIDAZOLE 0.75 % EX GEL: 1.0000 | CUTANEOUS | 6 refills | Status: AC

## 2023-12-26 NOTE — Patient Instructions (Addendum)
 Rosacea face Start Metronidazole 0.75% gel 1 to 2 times a day to face for Rosacea Recommend SPF daily  Rosacea  What is rosacea? Rosacea (say: ro-zay-sha) is a common skin disease that usually begins as a trend of flushing or blushing easily.  As rosacea progresses, a persistent redness in the center of the face will develop and may gradually spread beyond the nose and cheeks to the forehead and chin.  In some cases, the ears, chest, and back could be affected.  Rosacea may appear as tiny blood vessels or small red bumps that occur in crops.  Frequently they can contain pus, and are called "pustules".  If the bumps do not contain pus, they are referred to as "papules".  Rarely, in prolonged, untreated cases of rosacea, the oil glands of the nose and cheeks may become permanently enlarged.  This is called rhinophyma, and is seen more frequently in men.  Signs and Risks In its beginning stages, rosacea tends to come and go, which makes it difficult to recognize.  It can start as intermittent flushing of the face.  Eventually, blood vessels may become permanently visible.  Pustules and papules can appear, but can be mistaken for adult acne.  People of all races, ages, genders and ethnic groups are at risk of developing rosacea.  However, it is more common in women (especially around menopause) and adults with fair skin between the ages of 56 and 50.  Treatment Dermatologists typically recommend a combination of treatments to effectively manage rosacea.  Treatment can improve symptoms and may stop the progression of the rosacea.  Treatment may involve both topical and oral medications.  The tetracycline antibiotics are often used for their anti-inflammatory effect; however, because of the possibility of developing antibiotic resistance, they should not be used long term at full dose.  For dilated blood vessels the options include electrodessication (uses electric current through a small needle), laser  treatment, and cosmetics to hide the redness.   With all forms of treatment, improvement is a slow process, and patients may not see any results for the first 3-4 weeks.  It is very important to avoid the sun and other triggers.  Patients must wear sunscreen daily.  Skin Care Instructions: Cleanse the skin with a mild soap such as CeraVe cleanser, Cetaphil cleanser, or Dove soap once or twice daily as needed. Moisturize with Eucerin Redness Relief Daily Perfecting Lotion (has a subtle green tint), CeraVe Moisturizing Cream, or Oil of Olay Daily Moisturizer with sunscreen every morning and/or night as recommended. Makeup should be "non-comedogenic" (won't clog pores) and be labeled "for sensitive skin". Good choices for cosmetics are: Neutrogena, Almay, and Physician's Formula.  Any product with a green tint tends to offset a red complexion. If your eyes are dry and irritated, use artificial tears 2-3 times per day and cleanse the eyelids daily with baby shampoo.  Have your eyes examined at least every 2 years.  Be sure to tell your eye doctor that you have rosacea. Alcoholic beverages tend to cause flushing of the skin, and may make rosacea worse. Always wear sunscreen, protect your skin from extreme hot and cold temperatures, and avoid spicy foods, hot drinks, and mechanical irritation such as rubbing, scrubbing, or massaging the face.  Avoid harsh skin cleansers, cleansing masks, astringents, and exfoliation. If a particular product burns or makes your face feel tight, then it is likely to flare your rosacea. If you are having difficulty finding a sunscreen that you can tolerate, you  may try switching to a chemical-free sunscreen.  These are ones whose active ingredient is zinc oxide or titanium dioxide only.  They should also be fragrance free, non-comedogenic, and labeled for sensitive skin. Rosacea triggers may vary from person to person.  There are a variety of foods that have been reported to  trigger rosacea.  Some patients find that keeping a diary of what they were doing when they flared helps them avoid triggers.   Cryotherapy Aftercare  Wash gently with soap and water everyday.   Apply Vaseline and Band-Aid daily until healed.    Due to recent changes in healthcare laws, you may see results of your pathology and/or laboratory studies on MyChart before the doctors have had a chance to review them. We understand that in some cases there may be results that are confusing or concerning to you. Please understand that not all results are received at the same time and often the doctors may need to interpret multiple results in order to provide you with the best plan of care or course of treatment. Therefore, we ask that you please give us  2 business days to thoroughly review all your results before contacting the office for clarification. Should we see a critical lab result, you will be contacted sooner.   If You Need Anything After Your Visit  If you have any questions or concerns for your doctor, please call our main line at [phone] and press option 4 to reach your doctor's medical assistant. If no one answers, please leave a voicemail as directed and we will return your call as soon as possible. Messages left after 4 pm will be answered the following business day.   You may also send us  a message via MyChart. We typically respond to MyChart messages within 1-2 business days.  For prescription refills, please ask your pharmacy to contact our office. Our fax number is [phone].  If you have an urgent issue when the clinic is closed that cannot wait until the next business day, you can page your doctor at the number below.    Please note that while we do our best to be available for urgent issues outside of office hours, we are not available 24/7.   If you have an urgent issue and are unable to reach us , you may choose to seek medical care at your doctor's office, retail  clinic, urgent care center, or emergency room.  If you have a medical emergency, please immediately call 911 or go to the emergency department.  Pager Numbers  - Dr. Bary Likes: [phone]  - Dr. Annette Barters: [phone]  - Dr. Felipe Horton: [phone]   In the event of inclement weather, please call our main line at [phone] for an update on the status of any delays or closures.  Dermatology Medication Tips: Please keep the boxes that topical medications come in in order to help keep track of the instructions about where and how to use these. Pharmacies typically print the medication instructions only on the boxes and not directly on the medication tubes.   If your medication is too expensive, please contact our office at [phone] option 4 or send us  a message through MyChart.   We are unable to tell what your co-pay for medications will be in advance as this is different depending on your insurance coverage. However, we may be able to find a substitute medication at lower cost or fill out paperwork to get insurance to cover a needed medication.   If a prior  authorization is required to get your medication covered by your insurance company, please allow us  1-2 business days to complete this process.  Drug prices often vary depending on where the prescription is filled and some pharmacies may offer cheaper prices.  The website www.goodrx.com contains coupons for medications through different pharmacies. The prices here do not account for what the cost may be with help from insurance (it may be cheaper with your insurance), but the website can give you the price if you did not use any insurance.  - You can print the associated coupon and take it with your prescription to the pharmacy.  - You may also stop by our office during regular business hours and pick up a GoodRx coupon card.  - If you need your prescription sent electronically to a different pharmacy, notify our office through [facility] or by phone at [phone] option 4.     Si Usted Necesita Algo Despus de Su Visita  Tambin puede enviarnos un mensaje a travs de Clinical cytogeneticist. Por lo general respondemos a los mensajes de MyChart en el transcurso de 1 a 2 das hbiles.  Para renovar recetas, por favor pida a su farmacia que se ponga en contacto con nuestra oficina. Franz Jacks de fax es [place] [phone].  Si tiene un asunto urgente cuando la clnica est cerrada y que no puede esperar hasta el siguiente da hbil, puede llamar/localizar a su doctor(a) al nmero que aparece a continuacin.   Por favor, tenga en cuenta que aunque hacemos todo lo posible para estar disponibles para asuntos urgentes fuera del horario de [place], no estamos disponibles las 24 horas del da, los 7 [address]  [address] de la [place].   Si tiene un problema urgente y no puede comunicarse con nosotros, puede optar por buscar atencin mdica  en el consultorio de su doctor(a), en una clnica privada, en un centro de atencin urgente o en una sala de emergencias.  Si tiene Engineer, drilling, por favor llame inmediatamente al 911 o vaya a la sala de emergencias.  Nmeros de bper  - Dr. Bary Likes: [phone]  - Dra. Annette Barters: [phone]  - Dr. Felipe Horton: [phone]   En caso de inclemencias del tiempo, por favor llame a Lajuan Pila principal al [phone] para una actualizacin sobre el [place] de cualquier retraso o cierre.  Consejos para la medicacin en dermatologa: Por favor, guarde las cajas en las que vienen los medicamentos de uso tpico para ayudarle a seguir las instrucciones sobre dnde y cmo usarlos. Las farmacias generalmente imprimen las instrucciones del medicamento slo en las cajas y no directamente en los tubos del [place].   Si su medicamento es muy caro, por favor, pngase en contacto con Bettyjane Brunet llamando al [phone] y presione la opcin 4 o envenos un mensaje a travs de Clinical cytogeneticist.   No podemos  decirle cul ser su copago por los medicamentos por adelantado ya que esto es diferente dependiendo de la cobertura de su seguro. Sin embargo, es posible que podamos encontrar un medicamento sustituto a Audiological scientist un formulario para que el seguro cubra el medicamento que se considera necesario.   Si se requiere una autorizacin previa para que su compaa de seguros [place] su medicamento, por favor permtanos de 1 a 2 das hbiles para completar este proceso.  Los precios de los medicamentos varan con frecuencia dependiendo del Environmental consultant de dnde se surte la receta y alguna farmacias pueden ofrecer precios ms baratos.  El sitio web www.goodrx.com tiene cupones  para medicamentos de Health and safety inspector. Los precios aqu no tienen en cuenta lo que podra costar con la ayuda del seguro (puede ser ms barato con su seguro), pero el sitio web puede darle el precio si no utiliz Tourist information centre manager.  - Puede imprimir el cupn correspondiente y llevarlo con su receta a la farmacia.  - Tambin puede pasar por nuestra oficina durante el horario de atencin regular y Education officer, museum una tarjeta de cupones de GoodRx.  - Si necesita que su receta se enve electrnicamente a una farmacia diferente, informe a nuestra oficina a travs de MyChart de [place] o por telfono llamando al [phone] y presione la opcin 4.

## 2023-12-26 NOTE — Progress Notes (Signed)
 Follow-Up Visit   Subjective  Billy Wells is a 73 y.o. male who presents for the following: Skin Cancer Screening and Upper Body Skin Exam, hx of AKs  The patient presents for Upper Body Skin Exam (UBSE) for skin cancer screening and mole check. The patient has spots, moles and lesions to be evaluated, some may be new or changing. History of Aks. No history of skin cancer.    The following portions of the chart were reviewed this encounter and updated as appropriate: medications, allergies, medical history  Review of Systems:  No other skin or systemic complaints except as noted in HPI or Assessment and Plan.  Objective  Well appearing patient in no apparent distress; mood and affect are within normal limits.  All skin waist up and legs examined. Relevant physical exam findings are noted in the Assessment and Plan.  L lower temple x 1, Pink/tan scaly macule  Assessment & Plan   AK (ACTINIC KERATOSIS) L lower temple x 1, Actinic keratoses are precancerous spots that appear secondary to cumulative UV radiation exposure/sun exposure over time. They are chronic with expected duration over 1 year. A portion of actinic keratoses will progress to squamous cell carcinoma of the skin. It is not possible to reliably predict which spots will progress to skin cancer and so treatment is recommended to prevent development of skin cancer.  Recommend daily broad spectrum sunscreen SPF 30+ to sun-exposed areas, reapply every 2 hours as needed.  Recommend staying in the shade or wearing long sleeves, sun glasses (UVA+UVB protection) and wide brim hats (4-inch brim around the entire circumference of the hat). Call for new or changing lesions. Destruction of lesion - L lower temple x 1,  Destruction method: cryotherapy   Informed consent: discussed and consent obtained   Lesion destroyed using liquid nitrogen: Yes   Region frozen until ice ball extended beyond lesion: Yes   Outcome: patient  tolerated procedure well with no complications   Post-procedure details: wound care instructions given   Additional details:  Prior to procedure, discussed risks of blister formation, small wound, skin dyspigmentation, or rare scar following cryotherapy. Recommend Vaseline ointment to treated areas while healing.   Skin cancer screening performed today.  Actinic Damage - Chronic condition, secondary to cumulative UV/sun exposure - diffuse scaly erythematous macules with underlying dyspigmentation - Recommend daily broad spectrum sunscreen SPF 30+ to sun-exposed areas, reapply every 2 hours as needed.  - Staying in the shade or wearing long sleeves, sun glasses (UVA+UVB protection) and wide brim hats (4-inch brim around the entire circumference of the hat) are also recommended for sun protection.  - Call for new or changing lesions.  Lentigines, Seborrheic Keratoses, Hemangiomas - Benign normal skin lesions - Benign-appearing - Call for any changes  Melanocytic Nevi - Tan-brown and/or pink-flesh-colored symmetric macules and papules - Benign appearing on exam today - Observation - Call clinic for new or changing moles - Recommend daily use of broad spectrum spf 30+ sunscreen to sun-exposed areas.   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 Condition is bothersome/symptomatic for patient. Currently flared.   Rosacea is a chronic progressive skin condition usually affecting the face of adults, causing redness and/or acne bumps. It is treatable but not curable. It sometimes affects the eyes (ocular rosacea) as well. It may respond to topical and/or systemic medication and can flare with stress, sun exposure,  alcohol, exercise, topical steroids (including hydrocortisone/cortisone 10) and some foods.  Daily application of broad spectrum spf 30+ sunscreen to face  is recommended to reduce flares.  Patient denies grittiness of the eyes  Treatment Plan Start Metronidazole 0.75% gel qd/bid to face   Purpura - Chronic; persistent and recurrent.  Treatable, but not curable. - Violaceous macules and patches - Benign - Related to trauma, age, sun damage and/or use of blood thinners, chronic use of topical and/or oral steroids - Observe - Can use OTC arnica containing moisturizer such as Dermend Bruise Formula if desired - Call for worsening or other concerns   Return in about 1 year (around 12/25/2024) for UBSE, Hx of AKs.  I, Bernardine Bridegroom, CMA, am acting as scribe for Artemio Larry, MD .  I, Rollie Clipper, RMA, am acting as scribe for Artemio Larry, MD .   Documentation: I have reviewed the above documentation for accuracy and completeness, and I agree with the above.  Artemio Larry, MD

## 2024-12-31 ENCOUNTER — Ambulatory Visit: Admitting: Dermatology
